# Patient Record
Sex: Female | Born: 1955 | Race: White | Hispanic: No | Marital: Married | Smoking: Never smoker
Health system: Southern US, Community
[De-identification: ages and names within clinical notes are randomized; demographics above are authoritative.]

## PROBLEM LIST (undated history)

## (undated) DIAGNOSIS — Z923 Personal history of irradiation: Secondary | ICD-10-CM

## (undated) DIAGNOSIS — Z9889 Other specified postprocedural states: Secondary | ICD-10-CM

## (undated) DIAGNOSIS — E039 Hypothyroidism, unspecified: Secondary | ICD-10-CM

## (undated) DIAGNOSIS — R112 Nausea with vomiting, unspecified: Secondary | ICD-10-CM

---
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
## 1999-06-12 ENCOUNTER — Other Ambulatory Visit: Admission: RE | Admit: 1999-06-12 | Discharge: 1999-06-12 | Payer: Self-pay | Admitting: Obstetrics & Gynecology

## 2001-03-08 ENCOUNTER — Other Ambulatory Visit: Admission: RE | Admit: 2001-03-08 | Discharge: 2001-03-08 | Payer: Self-pay | Admitting: Obstetrics & Gynecology

## 2002-09-06 ENCOUNTER — Other Ambulatory Visit: Admission: RE | Admit: 2002-09-06 | Discharge: 2002-09-06 | Payer: Self-pay | Admitting: Obstetrics & Gynecology

## 2003-09-18 ENCOUNTER — Other Ambulatory Visit: Admission: RE | Admit: 2003-09-18 | Discharge: 2003-09-18 | Payer: Self-pay | Admitting: Obstetrics & Gynecology

## 2004-11-20 ENCOUNTER — Other Ambulatory Visit: Admission: RE | Admit: 2004-11-20 | Discharge: 2004-11-20 | Payer: Self-pay | Admitting: Obstetrics & Gynecology

## 2006-06-22 HISTORY — PX: EYE SURGERY: SHX253

## 2020-08-14 LAB — COLOGUARD: COLOGUARD: NEGATIVE

## 2021-04-02 ENCOUNTER — Other Ambulatory Visit: Payer: Self-pay | Admitting: Obstetrics & Gynecology

## 2021-04-02 DIAGNOSIS — N6320 Unspecified lump in the left breast, unspecified quadrant: Secondary | ICD-10-CM

## 2021-04-10 ENCOUNTER — Ambulatory Visit
Admission: RE | Admit: 2021-04-10 | Discharge: 2021-04-10 | Disposition: A | Discharge status: Home / Self Care | Payer: BC Managed Care – PPO | Source: Ambulatory Visit | Attending: Obstetrics & Gynecology | Admitting: Obstetrics & Gynecology

## 2021-04-10 ENCOUNTER — Ambulatory Visit
Admission: RE | Admit: 2021-04-10 | Discharge: 2021-04-10 | Disposition: A | Discharge status: Home / Self Care | Payer: Self-pay | Source: Ambulatory Visit | Attending: Obstetrics & Gynecology | Admitting: Obstetrics & Gynecology

## 2021-04-10 ENCOUNTER — Other Ambulatory Visit: Payer: Self-pay | Admitting: Obstetrics & Gynecology

## 2021-04-10 ENCOUNTER — Other Ambulatory Visit: Payer: Self-pay

## 2021-04-10 DIAGNOSIS — N6320 Unspecified lump in the left breast, unspecified quadrant: Secondary | ICD-10-CM

## 2021-04-15 ENCOUNTER — Ambulatory Visit
Admission: RE | Admit: 2021-04-15 | Discharge: 2021-04-15 | Disposition: A | Discharge status: Home / Self Care | Payer: BC Managed Care – PPO | Source: Ambulatory Visit | Attending: Obstetrics & Gynecology | Admitting: Obstetrics & Gynecology

## 2021-04-15 DIAGNOSIS — N6320 Unspecified lump in the left breast, unspecified quadrant: Secondary | ICD-10-CM

## 2021-04-15 DIAGNOSIS — C801 Malignant (primary) neoplasm, unspecified: Secondary | ICD-10-CM

## 2021-04-15 HISTORY — DX: Malignant (primary) neoplasm, unspecified: C80.1

## 2021-04-17 ENCOUNTER — Other Ambulatory Visit: Payer: BC Managed Care – PPO

## 2021-04-28 ENCOUNTER — Other Ambulatory Visit: Payer: Self-pay | Admitting: General Surgery

## 2021-04-28 DIAGNOSIS — C50412 Malignant neoplasm of upper-outer quadrant of left female breast: Secondary | ICD-10-CM

## 2021-04-28 DIAGNOSIS — Z170 Estrogen receptor positive status [ER+]: Secondary | ICD-10-CM

## 2021-04-30 NOTE — Progress Notes (Signed)
Surgical Instructions    Your procedure is scheduled on 05/08/21.  Report to [facility] Main Entrance "A" at 12:00 P.M., then check in with the Admitting office.  Call this number if you have problems the morning of surgery:  [phone]   If you have any questions prior to your surgery date call [phone]: Open Monday-Friday 8am-4pm    Remember:  Do not eat after midnight the night before your surgery  You may drink clear liquids until 11:00am the morning of your surgery.   Clear liquids allowed are: Water, Non-Citrus Juices (without pulp), Carbonated Beverages, Clear Tea, Black Coffee ONLY (NO MILK, CREAM OR POWDERED CREAMER of any kind), and Gatorade  Patient Instructions  The night before surgery:  No food after midnight. ONLY clear liquids after midnight  The day of surgery (if you do NOT have diabetes):  Drink ONE (1) Pre-Surgery Clear Ensure by 11:00am the morning of surgery. Drink in one sitting. Do not sip.  This drink was given to you during your hospital  pre-op appointment visit. Nothing else to drink after completing the  Pre-Surgery Clear Ensure.         If you have questions, please contact your surgeon's office.     Take these medicines the morning of surgery with A SIP OF WATER  cetirizine (ZYRTEC) fluticasone (FLONASE) SYNTHROID  meclizine (ANTIVERT) IF NEEDED  As of today, STOP taking any Aspirin (unless otherwise instructed by your surgeon) Aleve, Naproxen, Ibuprofen, Motrin, Advil, Goody's, BC's, all herbal medications, fish oil, and all vitamins.     After your COVID test   You are not required to quarantine however you are required to wear a well-fitting mask when you are out and around people not in your household.  If your mask becomes wet or soiled, replace with a new one.  Wash your hands often with soap and water for 20 seconds or clean your hands with an alcohol-based hand sanitizer that contains at least 60% alcohol.  Do not share  personal items.  Notify your provider: if you are in close contact with someone who has COVID  or if you develop a fever of 100.4 or greater, sneezing, cough, sore throat, shortness of breath or body aches.             Do not wear jewelry or makeup Do not wear lotions, powders, perfumes/colognes, or deodorant. Do not shave 48 hours prior to surgery.   Do not bring valuables to the hospital. DO Not wear nail polish, gel polish, artificial nails, or any other type of covering on natural nails including finger and toenails. If patients have artificial nails, gel coating, etc. that need to be removed by a nail salon, please have this removed prior to surgery or surgery may need to be canceled/delayed if the surgeon/ anesthesia feels like the patient is unable to be adequately monitored.             [facility] is not responsible for any belongings or valuables.  Do NOT Smoke (Tobacco/Vaping)  24 hours prior to your procedure  If you use a CPAP at night, you may bring your mask for your overnight stay.   Contacts, glasses, hearing aids, dentures or partials may not be worn into surgery, please bring cases for these belongings   For patients admitted to the hospital, discharge time will be determined by your treatment team.   Patients discharged the day of surgery will not be allowed to drive home, and someone needs  to stay with them for 24 hours.  NO VISITORS WILL BE ALLOWED IN PRE-OP WHERE PATIENTS ARE PREPPED FOR SURGERY.  ONLY 1 SUPPORT PERSON MAY BE PRESENT IN THE WAITING ROOM WHILE YOU ARE IN SURGERY.  IF YOU ARE TO BE ADMITTED, ONCE YOU ARE IN YOUR ROOM YOU WILL BE ALLOWED TWO (2) VISITORS. 1 (ONE) VISITOR MAY STAY OVERNIGHT BUT MUST ARRIVE TO THE ROOM BY 8pm.  Minor children may have two parents present. Special consideration for safety and communication needs will be reviewed on a case by case basis.  Special instructions:    Oral Hygiene is also important to reduce your risk of  infection.  Remember - BRUSH YOUR TEETH THE MORNING OF SURGERY WITH YOUR REGULAR TOOTHPASTE   [facility]- Preparing For Surgery  Before surgery, you can play an important role. Because skin is not sterile, your skin needs to be as free of germs as possible. You can reduce the number of germs on your skin by washing with CHG (chlorahexidine gluconate) Soap before surgery.  CHG is an antiseptic cleaner which kills germs and bonds with the skin to continue killing germs even after washing.     Please do not use if you have an allergy to CHG or antibacterial soaps. If your skin becomes reddened/irritated stop using the CHG.  Do not shave (including legs and underarms) for at least 48 hours prior to first CHG shower. It is OK to shave your face.  Please follow these instructions carefully.     Shower the NIGHT BEFORE SURGERY and the MORNING OF SURGERY with CHG Soap.   If you chose to wash your hair, wash your hair first as usual with your normal shampoo. After you shampoo, rinse your hair and body thoroughly to remove the shampoo.  Then [employer] and genitals (private parts) with your normal soap and rinse thoroughly to remove soap.  After that Use CHG Soap as you would any other liquid soap. You can apply CHG directly to the skin and wash gently with a scrungie or a clean washcloth.   Apply the CHG Soap to your body ONLY FROM THE NECK DOWN.  Do not use on open wounds or open sores. Avoid contact with your eyes, ears, mouth and genitals (private parts). Wash Face and genitals (private parts)  with your normal soap.   Wash thoroughly, paying special attention to the area where your surgery will be performed.  Thoroughly rinse your body with warm water from the neck down.  DO NOT shower/wash with your normal soap after using and rinsing off the CHG Soap.  Pat yourself dry with a CLEAN TOWEL.  Wear CLEAN PAJAMAS to bed the night before surgery  Place CLEAN SHEETS on your bed the night before  your surgery  DO NOT SLEEP WITH PETS.   Day of Surgery: Take a shower with CHG soap. Wear Clean/Comfortable clothing the morning of surgery Do not apply any deodorants/lotions.   Remember to brush your teeth WITH YOUR REGULAR TOOTHPASTE.   Please read over the following fact sheets that you were given.

## 2021-05-01 ENCOUNTER — Encounter (HOSPITAL_COMMUNITY): Payer: Self-pay

## 2021-05-01 ENCOUNTER — Other Ambulatory Visit: Payer: Self-pay

## 2021-05-01 ENCOUNTER — Encounter (HOSPITAL_COMMUNITY)
Admission: RE | Admit: 2021-05-01 | Discharge: 2021-05-01 | Disposition: A | Discharge status: Home / Self Care | Payer: BC Managed Care – PPO | Source: Ambulatory Visit | Attending: General Surgery | Admitting: General Surgery

## 2021-05-01 VITALS — BP 142/88 | HR 81 | Temp 97.7°F | Resp 17 | Ht 64.0 in | Wt 155.3 lb

## 2021-05-01 DIAGNOSIS — Z01812 Encounter for preprocedural laboratory examination: Secondary | ICD-10-CM | POA: Diagnosis not present

## 2021-05-01 DIAGNOSIS — Z01818 Encounter for other preprocedural examination: Secondary | ICD-10-CM

## 2021-05-01 HISTORY — DX: Nausea with vomiting, unspecified: R11.2

## 2021-05-01 HISTORY — DX: Nausea with vomiting, unspecified: Z98.890

## 2021-05-01 HISTORY — DX: Hypothyroidism, unspecified: E03.9

## 2021-05-01 LAB — CBC
HCT: 44.1 % (ref 36.0–46.0)
Hemoglobin: 14.8 g/dL (ref 12.0–15.0)
MCH: 30.2 pg (ref 26.0–34.0)
MCHC: 33.6 g/dL (ref 30.0–36.0)
MCV: 90 fL (ref 80.0–100.0)
Platelets: 265 10*3/uL (ref 150–400)
RBC: 4.9 MIL/uL (ref 3.87–5.11)
RDW: 12.7 % (ref 11.5–15.5)
WBC: 6.1 10*3/uL (ref 4.0–10.5)
nRBC: 0 % (ref 0.0–0.2)

## 2021-05-01 NOTE — Progress Notes (Signed)
PCP - Dr. Terrill Mohr Cardiologist - denies  PPM/ICD - denies   Chest x-ray - denies EKG - denies Stress Test - denies ECHO - denies Cardiac Cath - denies  Sleep Study - denies   DM- denies  Blood Thinner Instructions: n/a Aspirin Instructions: n/a  ERAS Protcol - yes PRE-SURGERY Ensure- given  COVID TEST-  n/a, ambulatory surgery   Anesthesia review:  no  Patient denies shortness of breath, fever, cough and chest pain at PAT appointment   All instructions explained to the patient, with a verbal understanding of the material. Patient agrees to go over the instructions while at home for a better understanding. Patient also instructed to wear a mask in public for 3 days prior to surgery. The opportunity to ask questions was provided.

## 2021-05-05 NOTE — Progress Notes (Signed)
New Breast Cancer Diagnosis: Left breast UOQ  Did patient present with symptoms (if so, please note symptoms) or screening mammography?:Palpable mass    Location and Extent of disease :left breast. Located at 3 o'clock position, measured  0.7 cm in greatest dimension. Adenopathy no.  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 Barry Dienes -Left breast Lumpectomy with SLN biopsy 05/08/2021  Medical oncologist, treatment if any:  Dr. Burr Medico 05/06/2021   Family History of Breast/Ovarian/Prostate Cancer: Mother had breast cancer diagnosed in her 16's, Maternal aunt had breast cancer.  Lymphedema issues, if any: No      Pain issues, if any: Has slight tenderness.  SAFETY ISSUES: Prior radiation? No Pacemaker/ICD? No Possible current pregnancy? Potmenopausal Is the patient on methotrexate? No  Current Complaints / other details:   Genetics:

## 2021-05-06 ENCOUNTER — Encounter: Payer: Self-pay | Admitting: Hematology

## 2021-05-06 ENCOUNTER — Encounter: Payer: Self-pay | Admitting: Radiation Oncology

## 2021-05-06 ENCOUNTER — Ambulatory Visit
Admission: RE | Admit: 2021-05-06 | Discharge: 2021-05-06 | Disposition: A | Discharge status: Home / Self Care | Payer: BC Managed Care – PPO | Source: Ambulatory Visit | Attending: Radiation Oncology | Admitting: Radiation Oncology

## 2021-05-06 ENCOUNTER — Other Ambulatory Visit: Payer: Self-pay

## 2021-05-06 ENCOUNTER — Telehealth: Payer: Self-pay | Admitting: Hematology

## 2021-05-06 ENCOUNTER — Inpatient Hospital Stay: Payer: BC Managed Care – PPO | Attending: Hematology | Admitting: Hematology

## 2021-05-06 VITALS — BP 139/71 | HR 80 | Temp 97.8°F | Resp 16 | Ht 64.0 in | Wt 156.3 lb

## 2021-05-06 DIAGNOSIS — Z170 Estrogen receptor positive status [ER+]: Secondary | ICD-10-CM | POA: Insufficient documentation

## 2021-05-06 DIAGNOSIS — Z8052 Family history of malignant neoplasm of bladder: Secondary | ICD-10-CM | POA: Diagnosis not present

## 2021-05-06 DIAGNOSIS — Z808 Family history of malignant neoplasm of other organs or systems: Secondary | ICD-10-CM | POA: Insufficient documentation

## 2021-05-06 DIAGNOSIS — C50012 Malignant neoplasm of nipple and areola, left female breast: Secondary | ICD-10-CM | POA: Insufficient documentation

## 2021-05-06 DIAGNOSIS — Z803 Family history of malignant neoplasm of breast: Secondary | ICD-10-CM | POA: Insufficient documentation

## 2021-05-06 DIAGNOSIS — E039 Hypothyroidism, unspecified: Secondary | ICD-10-CM | POA: Insufficient documentation

## 2021-05-06 NOTE — Progress Notes (Signed)
[place]   [phone] [phone]   Clinic New Consult Note   Patient Care Team: Nickola Major, MD as PCP - General (Family Medicine)  Date of Service:  05/06/2021   CHIEF COMPLAINTS/PURPOSE OF CONSULTATION:  Left Breast Cancer, ER+  REFERRING PHYSICIAN:  Dr. Barry Dienes   ASSESSMENT & PLAN:  Sheila Bryant is a 65 y.o. postmenopausal female with a history of hypothyroidism   1. 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 B/l diagnostic MM and [place] 04/10/21 showed 0.9 cm mass at 3 o'clock retroareolar. Biopsy 04/15/21 confirmed invasive ductal carcinoma, grade 2-3 --We discussed her imaging findings and the biopsy results in great details. -She met Dr. Barry Dienes on 04/28/21 and is scheduled for lumpectomy on 05/08/21. -I recommend a Oncotype Dx test on the surgical sample and we'll make a decision about adjuvant chemotherapy based on the Oncotype result. Written material of this test was given to her. She is young and fit, would be a good candidate for chemotherapy if her Oncotype recurrence score is high. -If her surgical sentinel lymph node is positive, I recommend mammaprint for further risk stratification and guide adjuvant chemotherapy. -Giving the strong ER and PR expression in her postmenopausal status, I recommend adjuvant endocrine therapy with aromatase inhibitor for a total of 5-10 years to reduce the risk of cancer recurrence. Potential benefits and side effects were discussed with patient and she is interested. She was on estrogen replacement before, which she has stopped recently after cancer diagnosis. -She will also see radiation oncologist Dr. Lisbeth Renshaw today. She will likely benefit from breast radiation if she undergo lumpectomy to decrease the risk of breast cancer.  -We also discussed the breast cancer surveillance after her surgery. She will continue  annual screening mammogram, self exam, and a routine office visit with lab and exam with [place]. -I encouraged her to have healthy diet and exercise regularly.   2. Bone Health  -she reports her last DEXA was a number of years ago. We will obtain a new one for baseline.  3. Genetics  -Given her family history of breast cancer in her mother and maternal aunt, I recommended genetic testing, will set up for her   PLAN:  -proceed with lumpectomy and SLN biopsy on 05/08/21 with Dr. Barry Dienes -Oncotype on surgical sample  -I will see her 2-3 weeks after surgery to review her pathology and oncotype results.   Oncology History  Malignant neoplasm of areola of left breast in female, estrogen receptor positive ([place])  04/10/2021 Mammogram   EXAM: DIGITAL DIAGNOSTIC BILATERAL MAMMOGRAM WITH TOMOSYNTHESIS AND CAD; ULTRASOUND LEFT BREAST LIMITED; ULTRASOUND RIGHT BREAST LIMITED  IMPRESSION: 1. Suspicious palpable 0.9 cm mass in the 3 o'clock retroareolar region of the LEFT breast for which biopsy is recommended. 2. LEFT axilla is negative.   04/15/2021 Pathology Results   Diagnosis Breast, left, needle core biopsy, 3 o'clock retroareolar - INVASIVE DUCTAL CARCINOMA - SEE COMMENT Microscopic Comment Based on the biopsy, the carcinoma appears Nottingham grade 2-3 of 3 and measures 0.7 cm in greatest linear extent.  PROGNOSTIC INDICATORS Results: The tumor cells are EQUIVOCAL for Her2 (2+). Her2 by FISH will be performed and results reported separately. Estrogen Receptor: 100%, POSITIVE, STRONG STAINING INTENSITY Progesterone Receptor: 100%, POSITIVE, STRONG STAINING INTENSITY Proliferation Marker Ki67: 15%  FLUORESCENCE IN-SITU HYBRIDIZATION Results: GROUP 5: HER2 **NEGATIVE**   05/06/2021 Initial Diagnosis   Malignant neoplasm of areola of  left breast in female, estrogen receptor positive ([place])      HISTORY OF PRESENTING ILLNESS:  Sheila Bryant 65 y.o. female is a here because of breast  cancer. The patient was referred by Dr. Barry Dienes. The patient presents to the clinic today alone.   She presented with a palpable left breast lump for about a week. She underwent bilateral diagnostic mammography and bilateral breast ultrasonography on 04/10/21 showing: palpable 0.9 cm mass in left breast at 3 o'clock retroareolar region; left axilla is negative.  Biopsy on 04/15/21 showed: invasive ductal carcinoma, grade 2-3. Prognostic indicators significant for: estrogen receptor, 100% positive and progesterone receptor, 100% positive. Proliferation marker Ki67 at 15%. HER2 negative by FISH.   Today the patient notes they felt/feeling prior/after... -no concerns   She has a PMHx of.... -s/p 2 c-sections    Socially... -works in administration at [employer] -she is married -breast cancer in mother at 38-72 and maternal aunt, colon cancer in maternal brother, bladder cancer in paternal aunt, GYN cancer (unsure type) in maternal grandmother   GYN HISTORY  Menarchal: 74 years old LMP: "31 years ago" (~65 years old) Contraceptive: used for ~10 years HRT: used for 10+ years (unsure if she started when her menopause did), and stopped about a week ago. GP: 2, first age 30   REVIEW OF SYSTEMS:    Constitutional: Denies fevers, chills or abnormal night sweats Eyes: Denies blurriness of vision, double vision or watery eyes Ears, nose, mouth, throat, and face: Denies mucositis or sore throat Respiratory: Denies cough, dyspnea or wheezes Cardiovascular: Denies palpitation, chest discomfort or lower extremity swelling Gastrointestinal:  Denies nausea, heartburn or change in bowel habits Skin: Denies abnormal skin rashes Lymphatics: Denies new lymphadenopathy or easy bruising Neurological:Denies numbness, tingling or new weaknesses Behavioral/Psych: Mood is stable, no new changes  All other systems were reviewed with the patient and are negative.   MEDICAL HISTORY:  Past  Medical History:  Diagnosis Date   Cancer ([place]) 04/15/2021   left breast   Hypothyroidism    PONV (postoperative nausea and vomiting)     SURGICAL HISTORY: Past Surgical History:  Procedure Laterality Date   CESAREAN SECTION     x2 (1987 and 1990)   EYE SURGERY Right 2008   clogged tear duct   WISDOM TOOTH EXTRACTION  1977    SOCIAL HISTORY: Social History   Socioeconomic History   Marital status: Married    Spouse name: Not on file   Number of children: 2   Years of education: Not on file   Highest education level: Not on file  Occupational History   Not on file  Tobacco Use   Smoking status: Never   Smokeless tobacco: Never  Vaping Use   Vaping Use: Never used  Substance and Sexual Activity   Alcohol use: Yes    Comment: "a couple beers once a month"   Drug use: Never   Sexual activity: Yes    Birth control/protection: Post-menopausal  Other Topics Concern   Not on file  Social History Narrative   Not on file   Social Determinants of Health   Financial Resource Strain: Not on file  Food Insecurity: Not on file  Transportation Needs: Not on file  Physical Activity: Not on file  Stress: Not on file  Social Connections: Not on file  Intimate Partner Violence: Not on file    FAMILY HISTORY: Family History  Problem Relation Age of Onset   Breast cancer Mother 79  Cancer Mother 9       breast cancer   COPD Mother    Heart disease Mother    Diabetes Father    Heart disease Father    Cancer Brother        bladder cancer   Cancer Maternal Aunt 11       breast cancer   Cancer Paternal Aunt        bladder cancer   Cancer Maternal Grandmother        uterine cancer   ADD / ADHD Daughter    ADD / ADHD Son     ALLERGIES:  is allergic to tape.  MEDICATIONS:  Current Outpatient Medications  Medication Sig Dispense Refill   cetirizine (ZYRTEC) 10 MG tablet Take 10 mg by mouth daily.     fluticasone (FLONASE) 50 MCG/ACT nasal spray Place 1 spray  into both nostrils daily.     meclizine (ANTIVERT) 25 MG tablet Take 25 mg by mouth 3 (three) times daily as needed for dizziness.     SYNTHROID 75 MCG tablet Take 75 mcg by mouth daily before breakfast.     No current facility-administered medications for this visit.    PHYSICAL EXAMINATION: ECOG PERFORMANCE STATUS: 0 - Asymptomatic  Vitals:   05/06/21 1317  BP: 139/71  Pulse: 80  Resp: 16  Temp: 97.8 F (36.6 C)  SpO2: 100%   Filed Weights   05/06/21 1317  Weight: 156 lb 4.8 oz (70.9 kg)    GENERAL:alert, no distress and comfortable SKIN: skin color, texture, turgor are normal, no rashes or significant lesions EYES: normal, Conjunctiva are pink and non-injected, sclera clear  NECK: supple, thyroid normal size, non-tender, without nodularity LYMPH:  no palpable lymphadenopathy in the cervical, axillary  LUNGS: clear to auscultation and percussion with normal breathing effort HEART: regular rate & rhythm and no murmurs and no lower extremity edema ABDOMEN:abdomen soft, non-tender and normal bowel sounds Musculoskeletal:no cyanosis of digits and no clubbing  NEURO: alert & oriented x 3 with fluent speech, no focal motor/sensory deficits BREAST: very subtle nodule at 3 o'clock left breast next to nipple. No palpable mass or adenopathy bilaterally.  LABORATORY DATA:  I have reviewed the data as listed CBC Latest Ref Rng & Units 05/01/2021  WBC 4.0 - 10.5 K/uL 6.1  Hemoglobin 12.0 - 15.0 g/dL 14.8  Hematocrit 36.0 - 46.0 % 44.1  Platelets 150 - 400 K/uL 265    No flowsheet data found.   RADIOGRAPHIC STUDIES: I have personally reviewed the radiological images as listed and agreed with the findings in the report. US BREAST LTD UNI LEFT INC AXILLA  Result Date: 04/10/2021 CLINICAL DATA:  Palpable mass in the LEFT breast for 1 month. EXAM: DIGITAL DIAGNOSTIC BILATERAL MAMMOGRAM WITH TOMOSYNTHESIS AND CAD; ULTRASOUND LEFT BREAST LIMITED; ULTRASOUND RIGHT BREAST LIMITED  TECHNIQUE: Bilateral digital diagnostic mammography and breast tomosynthesis was performed. The images were evaluated with computer-aided detection.; Targeted ultrasound examination of the left breast was performed.; Targeted ultrasound examination of the right breast was performed COMPARISON:  Previous exam(s). ACR Breast Density Category c: The breast tissue is heterogeneously dense, which may obscure small masses. FINDINGS: RIGHT BREAST: Mammogram: A possible obscured mass is identified in the posterior central portion of the RIGHT breast. With additional images, there is no definitive abnormality. Mammographic images were processed with CAD. Physical Exam: I palpate no abnormality in the central portions of the RIGHT breast. Ultrasound: Targeted ultrasound is performed, showing normal appearing dense fibroglandular tissue in the  12 o'clock, retroareolar, and 6 o'clock locations of the RIGHT breast. No suspicious mass, distortion, or acoustic shadowing is demonstrated with ultrasound. LEFT BREAST: Mammogram: There is focal asymmetry in the LATERAL retroareolar region of the LEFT breast, marked with a BB as palpable. Spot tangential view suggests possible distortion associated with a superficial mass in this region. Mammographic images were processed with CAD. Physical Exam: I palpate a discrete oval mass in the 3 o'clock retroareolar region of the LEFT breast. Ultrasound: Targeted ultrasound is performed, showing an irregular hypoechoic mass in the 3 o'clock retroareolar region of the LEFT breast, measuring approximately 0.9 x 0.7 x 0 7 centimeters. At real-time exam, there is question of associated distortion. Evaluation of the LEFT axilla is negative for adenopathy. IMPRESSION: 1. Suspicious mass in the 3 o'clock retroareolar region of the LEFT breast for which biopsy is recommended. 2. LEFT axilla is negative. RECOMMENDATION: Recommend ultrasound-guided core biopsy LEFT breast. I have discussed the findings  and recommendations with the patient. If applicable, a reminder letter will be sent to the patient regarding the next appointment. BI-RADS CATEGORY  4: Suspicious. Electronically Signed   By: Nolon Nations M.D.   On: 04/10/2021 12:47  US BREAST LTD UNI RIGHT INC AXILLA  Result Date: 04/10/2021 CLINICAL DATA:  Palpable mass in the LEFT breast for 1 month. EXAM: DIGITAL DIAGNOSTIC BILATERAL MAMMOGRAM WITH TOMOSYNTHESIS AND CAD; ULTRASOUND LEFT BREAST LIMITED; ULTRASOUND RIGHT BREAST LIMITED TECHNIQUE: Bilateral digital diagnostic mammography and breast tomosynthesis was performed. The images were evaluated with computer-aided detection.; Targeted ultrasound examination of the left breast was performed.; Targeted ultrasound examination of the right breast was performed COMPARISON:  Previous exam(s). ACR Breast Density Category c: The breast tissue is heterogeneously dense, which may obscure small masses. FINDINGS: RIGHT BREAST: Mammogram: A possible obscured mass is identified in the posterior central portion of the RIGHT breast. With additional images, there is no definitive abnormality. Mammographic images were processed with CAD. Physical Exam: I palpate no abnormality in the central portions of the RIGHT breast. Ultrasound: Targeted ultrasound is performed, showing normal appearing dense fibroglandular tissue in the 12 o'clock, retroareolar, and 6 o'clock locations of the RIGHT breast. No suspicious mass, distortion, or acoustic shadowing is demonstrated with ultrasound. LEFT BREAST: Mammogram: There is focal asymmetry in the LATERAL retroareolar region of the LEFT breast, marked with a BB as palpable. Spot tangential view suggests possible distortion associated with a superficial mass in this region. Mammographic images were processed with CAD. Physical Exam: I palpate a discrete oval mass in the 3 o'clock retroareolar region of the LEFT breast. Ultrasound: Targeted ultrasound is performed, showing an  irregular hypoechoic mass in the 3 o'clock retroareolar region of the LEFT breast, measuring approximately 0.9 x 0.7 x 0 7 centimeters. At real-time exam, there is question of associated distortion. Evaluation of the LEFT axilla is negative for adenopathy. IMPRESSION: 1. Suspicious mass in the 3 o'clock retroareolar region of the LEFT breast for which biopsy is recommended. 2. LEFT axilla is negative. RECOMMENDATION: Recommend ultrasound-guided core biopsy LEFT breast. I have discussed the findings and recommendations with the patient. If applicable, a reminder letter will be sent to the patient regarding the next appointment. BI-RADS CATEGORY  4: Suspicious. Electronically Signed   By: Nolon Nations M.D.   On: 04/10/2021 12:47  MM DIAG BREAST TOMO BILATERAL  Result Date: 04/10/2021 CLINICAL DATA:  Palpable mass in the LEFT breast for 1 month. EXAM: DIGITAL DIAGNOSTIC BILATERAL MAMMOGRAM WITH TOMOSYNTHESIS AND CAD; ULTRASOUND LEFT BREAST  LIMITED; ULTRASOUND RIGHT BREAST LIMITED TECHNIQUE: Bilateral digital diagnostic mammography and breast tomosynthesis was performed. The images were evaluated with computer-aided detection.; Targeted ultrasound examination of the left breast was performed.; Targeted ultrasound examination of the right breast was performed COMPARISON:  Previous exam(s). ACR Breast Density Category c: The breast tissue is heterogeneously dense, which may obscure small masses. FINDINGS: RIGHT BREAST: Mammogram: A possible obscured mass is identified in the posterior central portion of the RIGHT breast. With additional images, there is no definitive abnormality. Mammographic images were processed with CAD. Physical Exam: I palpate no abnormality in the central portions of the RIGHT breast. Ultrasound: Targeted ultrasound is performed, showing normal appearing dense fibroglandular tissue in the 12 o'clock, retroareolar, and 6 o'clock locations of the RIGHT breast. No suspicious mass, distortion,  or acoustic shadowing is demonstrated with ultrasound. LEFT BREAST: Mammogram: There is focal asymmetry in the LATERAL retroareolar region of the LEFT breast, marked with a BB as palpable. Spot tangential view suggests possible distortion associated with a superficial mass in this region. Mammographic images were processed with CAD. Physical Exam: I palpate a discrete oval mass in the 3 o'clock retroareolar region of the LEFT breast. Ultrasound: Targeted ultrasound is performed, showing an irregular hypoechoic mass in the 3 o'clock retroareolar region of the LEFT breast, measuring approximately 0.9 x 0.7 x 0 7 centimeters. At real-time exam, there is question of associated distortion. Evaluation of the LEFT axilla is negative for adenopathy. IMPRESSION: 1. Suspicious mass in the 3 o'clock retroareolar region of the LEFT breast for which biopsy is recommended. 2. LEFT axilla is negative. RECOMMENDATION: Recommend ultrasound-guided core biopsy LEFT breast. I have discussed the findings and recommendations with the patient. If applicable, a reminder letter will be sent to the patient regarding the next appointment. BI-RADS CATEGORY  4: Suspicious. Electronically Signed   By: Nolon Nations M.D.   On: 04/10/2021 12:47  MM CLIP PLACEMENT LEFT  Result Date: 04/15/2021 CLINICAL DATA:  Status post left breast ultrasound-guided biopsy EXAM: 3D DIAGNOSTIC LEFT MAMMOGRAM POST ULTRASOUND BIOPSY COMPARISON:  Previous exam(s). FINDINGS: 3D Mammographic images were obtained following ultrasound guided biopsy of the left breast. The biopsy marking clip is in expected position at the site of biopsy. IMPRESSION: Appropriate positioning of the ribbon shaped biopsy marking clip at the site of biopsy in the retroareolar left breast. Final Assessment: Post Procedure Mammograms for Marker Placement Electronically Signed   By: Kristopher Oppenheim M.D.   On: 04/15/2021 09:24  [place] LT BREAST BX W LOC DEV 1ST LESION IMG BX SPEC US  GUIDE  Addendum Date: 04/18/2021   ADDENDUM REPORT: 04/18/2021 08:02 ADDENDUM: Pathology revealed GRADE II INVASIVE DUCTAL CARCINOMA of the LEFT breast, 3 o'clock, retroareolar, (ribbon clip). This was found to be concordant by Dr. Kristopher Oppenheim. Pathology results were discussed with the patient by telephone. The patient reported doing well after the biopsy with tenderness at the site. Post biopsy instructions and care were reviewed and questions were answered. The patient was encouraged to call [facility] for any additional concerns. My direct phone number was provided. Surgical consultation has been arranged with Dr. Stark Klein at [facility] on April 28, 2021. Pathology results reported by Terie Purser, RN on 04/18/2021. Electronically Signed   By: Kristopher Oppenheim M.D.   On: 04/18/2021 08:02   Result Date: 04/18/2021 CLINICAL DATA:  65 year old female with a suspicious left breast mass. EXAM: ULTRASOUND GUIDED LEFT BREAST CORE NEEDLE BIOPSY COMPARISON:  Previous exam(s).  PROCEDURE: I met with the patient and we discussed the procedure of ultrasound-guided biopsy, including benefits and alternatives. We discussed the high likelihood of a successful procedure. We discussed the risks of the procedure, including infection, bleeding, tissue injury, clip migration, and inadequate sampling. Informed written consent was given. The usual time-out protocol was performed immediately prior to the procedure. Lesion quadrant: Upper outer quadrant Using sterile technique and 1% Lidocaine as local anesthetic, under direct ultrasound visualization, a 14 gauge spring-loaded device was used to perform biopsy of a mass at the 3 o'clock retroareolar position using a lateral approach. At the conclusion of the procedure a ribbon shaped tissue marker clip was deployed into the biopsy cavity. Follow up 2 view mammogram was performed and dictated separately. IMPRESSION: Ultrasound guided  biopsy of the left breast. No apparent complications. Electronically Signed: By: Kristopher Oppenheim M.D. On: 04/15/2021 09:19    No orders of the defined types were placed in this encounter.   All questions were answered. The patient knows to call the clinic with any problems, questions or concerns. The total time spent in the appointment was 45 minutes.     Truitt Merle, MD 05/06/2021 9:03 PM  I, Wilburn Mylar, am acting as scribe for Truitt Merle, MD.   I have reviewed the above documentation for accuracy and completeness, and I agree with the above.

## 2021-05-06 NOTE — Progress Notes (Signed)
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   HISTORY OF PRESENT ILLNESS: Sheila Bryant is a 65 y.o. female seen at the request of Dr. Barry Dienes for a new diagnosis of left breast cancer.  The patient initially palpated a mass in the left breast and diagnostic imaging revealed a mass in the 3 o'clock position retroareolar measuring up to 9 mm. Her left axilla was negative for adenopathy. Additional evaluation of the right breast was also done due to possible finding on diagnostic mammogram for her left breast finding. This right ultrasound showed no mass or abnormality. 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  She met with Dr. Barry Dienes and has been offered left lumpectomy with sentinel lymph node biopsy.  This is scheduled for this Thursday.  She is seen to discuss adjuvant radiotherapy.     PREVIOUS RADIATION THERAPY: No   PAST MEDICAL HISTORY:  Past Medical History:  Diagnosis Date   Cancer ([place]) 04/15/2021   left breast   Hypothyroidism    PONV (postoperative nausea and vomiting)        PAST SURGICAL HISTORY: Past Surgical History:  Procedure Laterality Date   CESAREAN SECTION     x2 (1987 and 1990)   EYE SURGERY Right 2008   clogged tear duct   WISDOM TOOTH EXTRACTION  1977     FAMILY HISTORY:  Family History  Problem Relation Age of Onset   Breast cancer Mother 38   Cancer Mother 64       breast cancer   COPD Mother    Heart disease Mother    Diabetes Father    Heart disease Father    Cancer Brother         bladder cancer   Cancer Maternal Aunt 78       breast cancer   Cancer Paternal Aunt        bladder cancer   Cancer Maternal Grandmother        uterine cancer   ADD / ADHD Daughter    ADD / ADHD Son      SOCIAL HISTORY:  reports that she has never smoked. She has never used smokeless tobacco. She reports current alcohol use. She reports that she does not use drugs. The patient is married and lives in [place]. 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    ALLERGIES: Tape   MEDICATIONS:  Current Outpatient Medications  Medication Sig Dispense Refill   cetirizine (ZYRTEC) 10 MG tablet Take 10 mg by mouth daily.     fluticasone (FLONASE) 50 MCG/ACT nasal spray Place 1 spray into both nostrils daily.     meclizine (ANTIVERT) 25 MG tablet Take 25 mg by mouth 3 (three) times daily as needed for dizziness.     SYNTHROID 75 MCG tablet Take 75 mcg by mouth daily before breakfast.     No current facility-administered medications for this encounter.     REVIEW OF SYSTEMS: On review of systems, the patient reports  she is doing well with her diagnosis and very active and ready to have her surgery behind her. She's very active in the community, with her family and young grandchildren, and looking forward to her mother's 48th birthday next month. No complaints are verbalized.      PHYSICAL EXAM:  Wt Readings from Last 3 Encounters:  05/06/21 156 lb 4.8 oz (70.9 kg)  05/01/21 155 lb 4.8 oz (70.4 kg)   Temp Readings from Last 3 Encounters:  05/06/21 97.8 F (36.6 C) (Temporal)  05/01/21 97.7 F (36.5 C) (Oral)   BP Readings from Last 3 Encounters:  05/06/21 139/71  05/01/21 (!) 142/88   Pulse Readings from Last 3 Encounters:  05/06/21 80  05/01/21 81    In general this is a well appearing caucasian female in no acute distress. She's alert and oriented x4 and appropriate throughout the examination. Cardiopulmonary assessment is negative for acute distress  and she exhibits normal effort. Bilateral breast exam is deferred.    ECOG = 0  0 - Asymptomatic (Fully active, able to carry on all predisease activities without restriction)  1 - Symptomatic but completely ambulatory (Restricted in physically strenuous activity but ambulatory and able to carry out work of a light or sedentary nature. For example, light housework, office work)  2 - Symptomatic, <50% in bed during the day (Ambulatory and capable of all self care but unable to carry out any work activities. Up and about more than 50% of waking hours)  3 - Symptomatic, >50% in bed, but not bedbound (Capable of only limited self-care, confined to bed or chair 50% or more of waking hours)  4 - Bedbound (Completely disabled. Cannot carry on any self-care. Totally confined to bed or chair)  5 - Death   Eustace Pen MM, Creech RH, Tormey DC, et al. [phone]). "Toxicity and response criteria of the [facility] Group". [place] Oncol. 5 (6): 649-55    LABORATORY DATA:  Lab Results  Component Value Date   WBC 6.1 05/01/2021   HGB 14.8 05/01/2021   HCT 44.1 05/01/2021   MCV 90.0 05/01/2021   PLT 265 05/01/2021   No results found for: NA, K, CL, CO2 No results found for: ALT, AST, GGT, ALKPHOS, BILITOT    RADIOGRAPHY: US BREAST LTD UNI LEFT INC AXILLA  Result Date: 04/10/2021 CLINICAL DATA:  Palpable mass in the LEFT breast for 1 month. EXAM: DIGITAL DIAGNOSTIC BILATERAL MAMMOGRAM WITH TOMOSYNTHESIS AND CAD; ULTRASOUND LEFT BREAST LIMITED; ULTRASOUND RIGHT BREAST LIMITED TECHNIQUE: Bilateral digital diagnostic mammography and breast tomosynthesis was performed. The images were evaluated with computer-aided detection.; Targeted ultrasound examination of the left breast was performed.; Targeted ultrasound examination of the right breast was performed COMPARISON:  Previous exam(s). ACR Breast Density Category c: The breast tissue is heterogeneously dense, which may obscure small  masses. FINDINGS: RIGHT BREAST: Mammogram: A possible obscured mass is identified in the posterior central portion of the RIGHT breast. With additional images, there is no definitive abnormality. Mammographic images were processed with CAD. Physical Exam: I palpate no abnormality in the central portions of the RIGHT breast. Ultrasound: Targeted ultrasound is performed, showing normal appearing dense fibroglandular tissue in the 12 o'clock, retroareolar, and 6 o'clock locations of the RIGHT breast. No suspicious mass, distortion, or acoustic shadowing is demonstrated with ultrasound. LEFT BREAST: Mammogram: There is focal asymmetry in the LATERAL retroareolar region of the LEFT breast, marked with a BB as palpable. Spot tangential view suggests possible distortion associated with a  superficial mass in this region. Mammographic images were processed with CAD. Physical Exam: I palpate a discrete oval mass in the 3 o'clock retroareolar region of the LEFT breast. Ultrasound: Targeted ultrasound is performed, showing an irregular hypoechoic mass in the 3 o'clock retroareolar region of the LEFT breast, measuring approximately 0.9 x 0.7 x 0 7 centimeters. At real-time exam, there is question of associated distortion. Evaluation of the LEFT axilla is negative for adenopathy. IMPRESSION: 1. Suspicious mass in the 3 o'clock retroareolar region of the LEFT breast for which biopsy is recommended. 2. LEFT axilla is negative. RECOMMENDATION: Recommend ultrasound-guided core biopsy LEFT breast. I have discussed the findings and recommendations with the patient. If applicable, a reminder letter will be sent to the patient regarding the next appointment. BI-RADS CATEGORY  4: Suspicious. Electronically Signed   By: Nolon Nations M.D.   On: 04/10/2021 12:47  US BREAST LTD UNI RIGHT INC AXILLA  Result Date: 04/10/2021 CLINICAL DATA:  Palpable mass in the LEFT breast for 1 month. EXAM: DIGITAL DIAGNOSTIC BILATERAL MAMMOGRAM WITH  TOMOSYNTHESIS AND CAD; ULTRASOUND LEFT BREAST LIMITED; ULTRASOUND RIGHT BREAST LIMITED TECHNIQUE: Bilateral digital diagnostic mammography and breast tomosynthesis was performed. The images were evaluated with computer-aided detection.; Targeted ultrasound examination of the left breast was performed.; Targeted ultrasound examination of the right breast was performed COMPARISON:  Previous exam(s). ACR Breast Density Category c: The breast tissue is heterogeneously dense, which may obscure small masses. FINDINGS: RIGHT BREAST: Mammogram: A possible obscured mass is identified in the posterior central portion of the RIGHT breast. With additional images, there is no definitive abnormality. Mammographic images were processed with CAD. Physical Exam: I palpate no abnormality in the central portions of the RIGHT breast. Ultrasound: Targeted ultrasound is performed, showing normal appearing dense fibroglandular tissue in the 12 o'clock, retroareolar, and 6 o'clock locations of the RIGHT breast. No suspicious mass, distortion, or acoustic shadowing is demonstrated with ultrasound. LEFT BREAST: Mammogram: There is focal asymmetry in the LATERAL retroareolar region of the LEFT breast, marked with a BB as palpable. Spot tangential view suggests possible distortion associated with a superficial mass in this region. Mammographic images were processed with CAD. Physical Exam: I palpate a discrete oval mass in the 3 o'clock retroareolar region of the LEFT breast. Ultrasound: Targeted ultrasound is performed, showing an irregular hypoechoic mass in the 3 o'clock retroareolar region of the LEFT breast, measuring approximately 0.9 x 0.7 x 0 7 centimeters. At real-time exam, there is question of associated distortion. Evaluation of the LEFT axilla is negative for adenopathy. IMPRESSION: 1. Suspicious mass in the 3 o'clock retroareolar region of the LEFT breast for which biopsy is recommended. 2. LEFT axilla is negative.  RECOMMENDATION: Recommend ultrasound-guided core biopsy LEFT breast. I have discussed the findings and recommendations with the patient. If applicable, a reminder letter will be sent to the patient regarding the next appointment. BI-RADS CATEGORY  4: Suspicious. Electronically Signed   By: Nolon Nations M.D.   On: 04/10/2021 12:47  MM DIAG BREAST TOMO BILATERAL  Result Date: 04/10/2021 CLINICAL DATA:  Palpable mass in the LEFT breast for 1 month. EXAM: DIGITAL DIAGNOSTIC BILATERAL MAMMOGRAM WITH TOMOSYNTHESIS AND CAD; ULTRASOUND LEFT BREAST LIMITED; ULTRASOUND RIGHT BREAST LIMITED TECHNIQUE: Bilateral digital diagnostic mammography and breast tomosynthesis was performed. The images were evaluated with computer-aided detection.; Targeted ultrasound examination of the left breast was performed.; Targeted ultrasound examination of the right breast was performed COMPARISON:  Previous exam(s). ACR Breast Density Category c: The breast tissue is  heterogeneously dense, which may obscure small masses. FINDINGS: RIGHT BREAST: Mammogram: A possible obscured mass is identified in the posterior central portion of the RIGHT breast. With additional images, there is no definitive abnormality. Mammographic images were processed with CAD. Physical Exam: I palpate no abnormality in the central portions of the RIGHT breast. Ultrasound: Targeted ultrasound is performed, showing normal appearing dense fibroglandular tissue in the 12 o'clock, retroareolar, and 6 o'clock locations of the RIGHT breast. No suspicious mass, distortion, or acoustic shadowing is demonstrated with ultrasound. LEFT BREAST: Mammogram: There is focal asymmetry in the LATERAL retroareolar region of the LEFT breast, marked with a BB as palpable. Spot tangential view suggests possible distortion associated with a superficial mass in this region. Mammographic images were processed with CAD. Physical Exam: I palpate a discrete oval mass in the 3 o'clock  retroareolar region of the LEFT breast. Ultrasound: Targeted ultrasound is performed, showing an irregular hypoechoic mass in the 3 o'clock retroareolar region of the LEFT breast, measuring approximately 0.9 x 0.7 x 0 7 centimeters. At real-time exam, there is question of associated distortion. Evaluation of the LEFT axilla is negative for adenopathy. IMPRESSION: 1. Suspicious mass in the 3 o'clock retroareolar region of the LEFT breast for which biopsy is recommended. 2. LEFT axilla is negative. RECOMMENDATION: Recommend ultrasound-guided core biopsy LEFT breast. I have discussed the findings and recommendations with the patient. If applicable, a reminder letter will be sent to the patient regarding the next appointment. BI-RADS CATEGORY  4: Suspicious. Electronically Signed   By: Nolon Nations M.D.   On: 04/10/2021 12:47  MM CLIP PLACEMENT LEFT  Result Date: 04/15/2021 CLINICAL DATA:  Status post left breast ultrasound-guided biopsy EXAM: 3D DIAGNOSTIC LEFT MAMMOGRAM POST ULTRASOUND BIOPSY COMPARISON:  Previous exam(s). FINDINGS: 3D Mammographic images were obtained following ultrasound guided biopsy of the left breast. The biopsy marking clip is in expected position at the site of biopsy. IMPRESSION: Appropriate positioning of the ribbon shaped biopsy marking clip at the site of biopsy in the retroareolar left breast. Final Assessment: Post Procedure Mammograms for Marker Placement Electronically Signed   By: Kristopher Oppenheim M.D.   On: 04/15/2021 09:24  [place] LT BREAST BX W LOC DEV 1ST LESION IMG BX SPEC US GUIDE  Addendum Date: 04/18/2021   ADDENDUM REPORT: 04/18/2021 08:02 ADDENDUM: Pathology revealed GRADE II INVASIVE DUCTAL CARCINOMA of the LEFT breast, 3 o'clock, retroareolar, (ribbon clip). This was found to be concordant by Dr. Kristopher Oppenheim. Pathology results were discussed with the patient by telephone. The patient reported doing well after the biopsy with tenderness at the site. Post biopsy  instructions and care were reviewed and questions were answered. The patient was encouraged to call [facility] for any additional concerns. My direct phone number was provided. Surgical consultation has been arranged with Dr. Stark Klein at [facility] on April 28, 2021. Pathology results reported by Terie Purser, RN on 04/18/2021. Electronically Signed   By: Kristopher Oppenheim M.D.   On: 04/18/2021 08:02   Result Date: 04/18/2021 CLINICAL DATA:  65 year old female with a suspicious left breast mass. EXAM: ULTRASOUND GUIDED LEFT BREAST CORE NEEDLE BIOPSY COMPARISON:  Previous exam(s). PROCEDURE: I met with the patient and we discussed the procedure of ultrasound-guided biopsy, including benefits and alternatives. We discussed the high likelihood of a successful procedure. We discussed the risks of the procedure, including infection, bleeding, tissue injury, clip migration, and inadequate sampling. Informed written consent was given. The usual time-out protocol  was performed immediately prior to the procedure. Lesion quadrant: Upper outer quadrant Using sterile technique and 1% Lidocaine as local anesthetic, under direct ultrasound visualization, a 14 gauge spring-loaded device was used to perform biopsy of a mass at the 3 o'clock retroareolar position using a lateral approach. At the conclusion of the procedure a ribbon shaped tissue marker clip was deployed into the biopsy cavity. Follow up 2 view mammogram was performed and dictated separately. IMPRESSION: Ultrasound guided biopsy of the left breast. No apparent complications. Electronically Signed: By: Kristopher Oppenheim M.D. On: 04/15/2021 09:19      IMPRESSION/PLAN: 1. Stage IA, [id number], grade 3, ER/PR positive invasive ductal carcinoma of the left breast. Dr. Lisbeth Renshaw discusses the pathology findings and reviews the nature of early stage left breast disease. The consensus from the breast conference includes breast  conservation with lumpectomy with sentinel node biopsy. Depending on the size of the final tumor measurements rendered by pathology, the tumor may be tested for Oncotype Dx score to determine a role for systemic therapy. Provided that chemotherapy is not indicated, the patient's course would then be followed by external radiotherapy to the breast  to reduce risks of local recurrence followed by antiestrogen therapy. We discussed the risks, benefits, short, and long term effects of radiotherapy, as well as the curative intent, and the patient is interested in proceeding. Dr. Lisbeth Renshaw discusses the delivery and logistics of radiotherapy and anticipates a course of 4 or up to 6 1/2 weeks of radiotherapy to the left breast with deep inspiration breath hold technique. We will see her back a few weeks after surgery to discuss the simulation process and anticipate we starting radiotherapy about 4-6 weeks after surgery.  2. Possible genetic predisposition to malignancy. The patient is a candidate for genetic testing given her personal and family history. She was offered referral and was in the process of scheduling this appointment but needs to get back in touch with the schedulers for that department. I'll message Ms. Gwynn.   In a visit lasting 60 minutes, greater than 50% of the time was spent face to face reviewing her case, as well as in preparation of, discussing, and coordinating the patient's care.  The above documentation reflects my direct findings during this shared patient visit. Please see the separate note by Dr. Lisbeth Renshaw on this date for the remainder of the patient's plan of care.    Carola Rhine, [facility]    **Disclaimer: This note was dictated with voice recognition software. Similar sounding words can inadvertently be transcribed and this note may contain transcription errors which may not have been corrected upon publication of note.**

## 2021-05-06 NOTE — Telephone Encounter (Signed)
Scheduled appt per 11/14 referral. Pt is aware of appt date and time.  

## 2021-05-07 NOTE — H&P (Signed)
REFERRING PHYSICIAN: Stann Mainland  PROVIDER: Georgianne Fick, MD  Care Team: Patient Care Team: Terrill Mohr, MD as PCP - General (Family Medicine) Georgianne Fick, MD as Consulting Provider (Surgical Oncology) Danton Sewer, MD (Radiology)   MRN: [id number] DOB: October 14, 1955 DATE OF ENCOUNTER: 04/28/2021  Subjective   Chief Complaint: Breast Cancer   History of Present Illness: Sheila Bryant is a 65 y.o. female who is seen today as an office consultation at the request of Dr. Stann Mainland for evaluation of Breast Cancer .   Pt presents with a left breast cancer dx 03/2021 after self-detecting a palpable mass. Dx imaging showed a 9 mm mass at 3 o'clock in the retroareolar location. 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   She hasn't had a cancer dx before this. She does have family history of cancer, however. Her mother had breast cancer dx in her 41s. Her maternal grandmother had uterine cancer, maternal aunt had breast cancer, and a maternal uncle had bladder cancer.   Pt works as a Psychologist, clinical at [employer].  Diagnostic mammogram/us:04/10/21 ACR Breast Density Category c: The breast tissue is heterogeneously dense, which may obscure small masses.   FINDINGS: RIGHT BREAST:   Mammogram: A possible obscured mass is identified in the posterior central portion of the RIGHT breast. With additional images, there is no definitive abnormality. Mammographic images were processed with CAD.   Physical Exam: I palpate no abnormality in the central portions of the RIGHT breast.   Ultrasound: Targeted ultrasound is performed, showing normal appearing dense fibroglandular tissue in the 12 o'clock, retroareolar, and 6 o'clock locations of the RIGHT breast. No suspicious mass, distortion, or acoustic shadowing is demonstrated with ultrasound.   LEFT BREAST:   Mammogram:  There is focal asymmetry in the LATERAL retroareolar region of the LEFT breast, marked with a BB as palpable. Spot tangential view suggests possible distortion associated with a superficial mass in this region. Mammographic images were processed with CAD.   Physical Exam: I palpate a discrete oval mass in the 3 o'clock retroareolar region of the LEFT breast.   Ultrasound: Targeted ultrasound is performed, showing an irregular hypoechoic mass in the 3 o'clock retroareolar region of the LEFT breast, measuring approximately 0.9 x 0.7 x 0 7 centimeters. At real-time exam, there is question of associated distortion. Evaluation of the LEFT axilla is negative for adenopathy.   IMPRESSION: 1. Suspicious mass in the 3 o'clock retroareolar region of the LEFT breast for which biopsy is recommended. 2. LEFT axilla is negative.   RECOMMENDATION: Recommend ultrasound-guided core biopsy LEFT breast.   I have discussed the findings and recommendations with the patient. If applicable, a reminder letter will be sent to the patient regarding the next appointment.   BI-RADS CATEGORY 4: Suspicious.  Pathology core needle biopsy: 04/15/21 Breast, left, needle core biopsy, 3 o'clock retroareolar - INVASIVE DUCTAL CARCINOMA Based on the biopsy, the carcinoma appears Nottingham grade 2-3 of 3 and measures 0.7 cm in greatest linear extent.  Receptors: Estrogen Receptor: 100%, POSITIVE, STRONG STAINING INTENSITY Progesterone Receptor: 100%, POSITIVE, STRONG STAINING INTENSITY Proliferation Marker Ki67: 15% GROUP 5: HER2 **NEGATIVE**  Review of Systems: A complete review of systems was obtained from the patient. I have reviewed this information and discussed as appropriate with the patient. See HPI as well for other ROS.  Review of Systems  All other systems reviewed and are negative.  Medical History: Past Medical History:  Diagnosis Date   Thyroid disease   Patient Active Problem List   Diagnosis   Malignant neoplasm of upper-outer quadrant of left breast in female, estrogen receptor positive (CMS-HCC)   Family history of cancer   Past Surgical History:  Procedure Laterality Date   tear duct surgery 2009    No Known Allergies  Current Outpatient Medications on File Prior to Visit  Medication Sig Dispense Refill   fluticasone propionate (FLONASE) 50 mcg/actuation nasal spray fluticasone propionate 50 mcg/actuation nasal spray,suspension SHAKE LIQUID AND USE 1 SPRAY IN EACH NOSTRIL DAILY   cetirizine (ZYRTEC) 10 mg capsule Zyrtec 10 mg capsule Take by oral route.   levothyroxine (SYNTHROID) 75 MCG tablet Take 1 tablet (75 mcg total) by mouth once daily Take on an empty stomach with a glass of water at least 30-60 minutes before breakfast.   No current facility-administered medications on file prior to visit.   Family History  Problem Relation Age of Onset   High blood pressure (Hypertension) Mother   Diabetes Mother   Breast cancer Mother   Skin cancer Father   Hyperlipidemia (Elevated cholesterol) Father   Coronary Artery Disease (Blocked arteries around heart) Father   Diabetes Father   Coronary Artery Disease (Blocked arteries around heart) Brother   Hyperlipidemia (Elevated cholesterol) Brother   Skin cancer Brother    Social History   Tobacco Use  Smoking Status Never  Smokeless Tobacco Never    Social History   Socioeconomic History   Marital status: Married  Tobacco Use   Smoking status: Never   Smokeless tobacco: Never  Substance and Sexual Activity   Alcohol use: Never   Drug use: Never   Objective:   Vitals:  04/28/21 1003  BP: 122/80  Pulse: 80  Temp: 36.7 C (98 F)  SpO2: 97%  Weight: 68.8 kg (151 lb 9.6 oz)  Height: 162.6 cm (_0 )   Body mass index is 26.02 kg/m.  Gen: No acute distress. Well nourished and well groomed.  Neurological: Alert and oriented to person, place, and time. Coordination normal.  Head:  Normocephalic and atraumatic.  Eyes: Conjunctivae are normal. Pupils are equal, round, and reactive to light. No scleral icterus.  Neck: Normal range of motion. Neck supple. No tracheal deviation or thyromegaly present.  Cardiovascular: Normal rate, regular rhythm, normal heart sounds and intact distal pulses. Exam reveals no gallop and no friction rub. No murmur heard. Breast: small mass at 3 o'clock under the areola on the left. Palpable, but skin not involved. Mobile. Breast relatively symmetric. No LAD. No skin dimpling. No nipple retraction. No nipple discharge on either side.   Respiratory: Effort normal. No respiratory distress. No chest wall tenderness. Breath sounds normal. No wheezes, rales or rhonchi.  GI: Soft. Bowel sounds are normal. The abdomen is soft and nontender. There is no rebound and no guarding.  Musculoskeletal: Normal range of motion. Extremities are nontender.  Lymphadenopathy: No cervical, preauricular, postauricular or axillary adenopathy is present Skin: Skin is warm and dry. No rash noted. No diaphoresis. No erythema. No pallor. No clubbing, cyanosis, or edema.  Psychiatric: Normal mood and affect. Behavior is normal. Judgment and thought content normal.   Labs n/a  Assessment and Plan:   Malignant neoplasm of upper-outer quadrant of left breast in female, estrogen receptor positive (CMS-HCC) Pt has a new dx of a cT1bN0 hormone positive breast cancer. Will plan a lumpectomy and sentinel node biopsy. She will need genetics referral in  addition to medical and radiation oncology.   The surgical procedure was described to the patient. I discussed the incision type and location and that we would need radiology involved on with a wire or seed marker and/or sentinel node.   The risks and benefits of the procedure were described to the patient and she wishes to proceed.   We discussed the risks bleeding, infection, damage to other structures, need for further  procedures/surgeries. We discussed the risk of seroma. The patient was advised if the area in the breast in cancer, we may need to go back to surgery for additional tissue to obtain negative margins or for a lymph node biopsy. The patient was advised that these are the most common complications, but that others can occur as well. They were advised against taking aspirin or other anti-inflammatory agents/blood thinners the week before surgery.   Pt's husband is present for the visit.   Family history of cancer Genetic referral.  Return for breast cancer follow up.  Milus Height, MD FACS Surgical Oncology, General Surgery, Trauma and [facility] [place]

## 2021-05-08 ENCOUNTER — Ambulatory Visit (HOSPITAL_COMMUNITY)
Admission: RE | Admit: 2021-05-08 | Discharge: 2021-05-08 | Disposition: A | Discharge status: Home / Self Care | Payer: BC Managed Care – PPO | Attending: General Surgery | Admitting: General Surgery

## 2021-05-08 ENCOUNTER — Ambulatory Visit (HOSPITAL_COMMUNITY): Payer: BC Managed Care – PPO | Admitting: Anesthesiology

## 2021-05-08 ENCOUNTER — Other Ambulatory Visit: Payer: Self-pay

## 2021-05-08 ENCOUNTER — Encounter (HOSPITAL_COMMUNITY): Payer: Self-pay | Admitting: General Surgery

## 2021-05-08 ENCOUNTER — Encounter: Payer: Self-pay | Admitting: *Deleted

## 2021-05-08 ENCOUNTER — Encounter (HOSPITAL_COMMUNITY)
Admission: RE | Disposition: A | Discharge status: Home / Self Care | Payer: Self-pay | Source: Home / Self Care | Attending: General Surgery

## 2021-05-08 DIAGNOSIS — Z170 Estrogen receptor positive status [ER+]: Secondary | ICD-10-CM | POA: Diagnosis not present

## 2021-05-08 DIAGNOSIS — D0512 Intraductal carcinoma in situ of left breast: Secondary | ICD-10-CM | POA: Diagnosis not present

## 2021-05-08 DIAGNOSIS — C50412 Malignant neoplasm of upper-outer quadrant of left female breast: Secondary | ICD-10-CM | POA: Diagnosis present

## 2021-05-08 DIAGNOSIS — E039 Hypothyroidism, unspecified: Secondary | ICD-10-CM | POA: Diagnosis not present

## 2021-05-08 HISTORY — PX: BREAST LUMPECTOMY: SHX2

## 2021-05-08 HISTORY — PX: BREAST LUMPECTOMY WITH SENTINEL LYMPH NODE BIOPSY: SHX5597

## 2021-05-08 SURGERY — BREAST LUMPECTOMY WITH SENTINEL LYMPH NODE BX
Anesthesia: Regional | Site: Breast | Laterality: Left

## 2021-05-08 MED ORDER — FENTANYL CITRATE (PF) 250 MCG/5ML IJ SOLN
INTRAMUSCULAR | Status: AC
  Filled 2021-05-08: qty 5

## 2021-05-08 MED ORDER — OXYCODONE HCL 5 MG PO TABS: 5.0000 mg | ORAL_TABLET | Freq: Four times a day (QID) | ORAL | 0 refills | Status: DC | PRN

## 2021-05-08 MED ORDER — LACTATED RINGERS IV SOLN: INTRAVENOUS | Status: DC

## 2021-05-08 MED ORDER — PROPOFOL 500 MG/50ML IV EMUL
INTRAVENOUS | Status: DC | PRN
  Administered 2021-05-08: 125 ug/kg/min via INTRAVENOUS

## 2021-05-08 MED ORDER — CEFAZOLIN SODIUM-DEXTROSE 2-4 GM/100ML-% IV SOLN
2.0000 g | INTRAVENOUS | Status: AC
  Administered 2021-05-08: 14:00:00 2 g via INTRAVENOUS
  Filled 2021-05-08: qty 100

## 2021-05-08 MED ORDER — 0.9 % SODIUM CHLORIDE (POUR BTL) OPTIME
TOPICAL | Status: DC | PRN
  Administered 2021-05-08: 14:00:00 1000 mL

## 2021-05-08 MED ORDER — CHLORHEXIDINE GLUCONATE 0.12 % MT SOLN
15.0000 mL | Freq: Once | OROMUCOSAL | Status: AC
  Administered 2021-05-08: 12:00:00 15 mL via OROMUCOSAL
  Filled 2021-05-08: qty 15

## 2021-05-08 MED ORDER — DEXAMETHASONE SODIUM PHOSPHATE 10 MG/ML IJ SOLN
INTRAMUSCULAR | Status: DC | PRN
  Administered 2021-05-08: 5 mg

## 2021-05-08 MED ORDER — LIDOCAINE HCL (PF) 1 % IJ SOLN
INTRAMUSCULAR | Status: AC
  Filled 2021-05-08: qty 30

## 2021-05-08 MED ORDER — MIDAZOLAM HCL 2 MG/2ML IJ SOLN
INTRAMUSCULAR | Status: AC
  Filled 2021-05-08: qty 2

## 2021-05-08 MED ORDER — DEXAMETHASONE SODIUM PHOSPHATE 10 MG/ML IJ SOLN
INTRAMUSCULAR | Status: DC | PRN
  Administered 2021-05-08: 14:00:00 5 mg via INTRAVENOUS

## 2021-05-08 MED ORDER — ONDANSETRON HCL 4 MG/2ML IJ SOLN
INTRAMUSCULAR | Status: DC | PRN
  Administered 2021-05-08: 4 mg via INTRAVENOUS

## 2021-05-08 MED ORDER — ORAL CARE MOUTH RINSE: 15.0000 mL | Freq: Once | OROMUCOSAL | Status: AC

## 2021-05-08 MED ORDER — LIDOCAINE HCL 1 % IJ SOLN
INTRAMUSCULAR | Status: DC | PRN
  Administered 2021-05-08: 20 mL via INTRAMUSCULAR

## 2021-05-08 MED ORDER — FENTANYL CITRATE (PF) 100 MCG/2ML IJ SOLN
50.0000 ug | Freq: Once | INTRAMUSCULAR | Status: AC
  Administered 2021-05-08: 13:00:00 50 ug via INTRAVENOUS

## 2021-05-08 MED ORDER — PROPOFOL 10 MG/ML IV BOLUS
INTRAVENOUS | Status: DC | PRN
  Administered 2021-05-08 (×2): 30 mg via INTRAVENOUS
  Administered 2021-05-08: 150 mg via INTRAVENOUS

## 2021-05-08 MED ORDER — ACETAMINOPHEN 500 MG PO TABS
1000.0000 mg | ORAL_TABLET | ORAL | Status: AC
  Administered 2021-05-08: 12:00:00 1000 mg via ORAL
  Filled 2021-05-08: qty 2

## 2021-05-08 MED ORDER — BUPIVACAINE-EPINEPHRINE (PF) 0.25% -1:200000 IJ SOLN
INTRAMUSCULAR | Status: AC
  Filled 2021-05-08: qty 30

## 2021-05-08 MED ORDER — MIDAZOLAM HCL 2 MG/2ML IJ SOLN
INTRAMUSCULAR | Status: DC | PRN
  Administered 2021-05-08: 2 mg via INTRAVENOUS

## 2021-05-08 MED ORDER — PHENYLEPHRINE 40 MCG/ML (10ML) SYRINGE FOR IV PUSH (FOR BLOOD PRESSURE SUPPORT)
PREFILLED_SYRINGE | INTRAVENOUS | Status: DC | PRN
  Administered 2021-05-08: 120 ug via INTRAVENOUS

## 2021-05-08 MED ORDER — APREPITANT 40 MG PO CAPS
ORAL_CAPSULE | ORAL | Status: AC
  Filled 2021-05-08: qty 1

## 2021-05-08 MED ORDER — SCOPOLAMINE 1 MG/3DAYS TD PT72
1.0000 | MEDICATED_PATCH | TRANSDERMAL | Status: DC
  Administered 2021-05-08: 13:00:00 1.5 mg via TRANSDERMAL

## 2021-05-08 MED ORDER — APREPITANT 40 MG PO CAPS
40.0000 mg | ORAL_CAPSULE | Freq: Once | ORAL | Status: AC
  Administered 2021-05-08: 13:00:00 40 mg via ORAL

## 2021-05-08 MED ORDER — ROPIVACAINE HCL 5 MG/ML IJ SOLN
INTRAMUSCULAR | Status: DC | PRN
  Administered 2021-05-08: 30 mL via PERINEURAL

## 2021-05-08 MED ORDER — FENTANYL CITRATE (PF) 100 MCG/2ML IJ SOLN
INTRAMUSCULAR | Status: AC
  Filled 2021-05-08: qty 2

## 2021-05-08 MED ORDER — LIDOCAINE 2% (20 MG/ML) 5 ML SYRINGE
INTRAMUSCULAR | Status: DC | PRN
  Administered 2021-05-08: 40 mg via INTRAVENOUS

## 2021-05-08 MED ORDER — MIDAZOLAM HCL 2 MG/2ML IJ SOLN: 2.0000 mg | Freq: Once | INTRAMUSCULAR | Status: DC

## 2021-05-08 MED ORDER — ENSURE PRE-SURGERY PO LIQD: 296.0000 mL | Freq: Once | ORAL | Status: DC

## 2021-05-08 MED ORDER — SCOPOLAMINE 1 MG/3DAYS TD PT72
MEDICATED_PATCH | TRANSDERMAL | Status: AC
  Filled 2021-05-08: qty 1

## 2021-05-08 MED ORDER — CHLORHEXIDINE GLUCONATE CLOTH 2 % EX PADS: 6.0000 | MEDICATED_PAD | Freq: Once | CUTANEOUS | Status: DC

## 2021-05-08 MED ORDER — PROPOFOL 10 MG/ML IV BOLUS
INTRAVENOUS | Status: AC
  Filled 2021-05-08: qty 20

## 2021-05-08 MED ORDER — MAGTRACE LYMPHATIC TRACER
INTRAMUSCULAR | Status: DC | PRN
  Administered 2021-05-08: 14:00:00 2 mL via INTRAMUSCULAR

## 2021-05-08 MED ORDER — FENTANYL CITRATE (PF) 100 MCG/2ML IJ SOLN: 25.0000 ug | INTRAMUSCULAR | Status: DC | PRN

## 2021-05-08 SURGICAL SUPPLY — 45 items
BAG COUNTER SPONGE SURGICOUNT (BAG) ×2 IMPLANT
BINDER BREAST LRG (GAUZE/BANDAGES/DRESSINGS) ×2 IMPLANT
BINDER BREAST XLRG (GAUZE/BANDAGES/DRESSINGS) IMPLANT
BNDG COHESIVE 4X5 TAN STRL (GAUZE/BANDAGES/DRESSINGS) ×2 IMPLANT
CANISTER SUCT 3000ML PPV (MISCELLANEOUS) ×2 IMPLANT
CHLORAPREP W/TINT 26 (MISCELLANEOUS) ×2 IMPLANT
CLIP VESOCCLUDE LG 6/CT (CLIP) ×2 IMPLANT
CLIP VESOCCLUDE MED 24/CT (CLIP) ×2 IMPLANT
CNTNR URN SCR LID CUP LEK RST (MISCELLANEOUS) IMPLANT
CONT SPEC 4OZ STRL OR WHT (MISCELLANEOUS)
COVER PROBE W GEL 5X96 (DRAPES) ×2 IMPLANT
COVER SURGICAL LIGHT HANDLE (MISCELLANEOUS) ×2 IMPLANT
DERMABOND ADVANCED (GAUZE/BANDAGES/DRESSINGS) ×1
DERMABOND ADVANCED .7 DNX12 (GAUZE/BANDAGES/DRESSINGS) ×1 IMPLANT
DEVICE DUBIN SPECIMEN MAMMOGRA (MISCELLANEOUS) IMPLANT
DRAPE CHEST BREAST 15X10 FENES (DRAPES) ×2 IMPLANT
DRAPE SURG 17X23 STRL (DRAPES) IMPLANT
DRSG PAD ABDOMINAL 8X10 ST (GAUZE/BANDAGES/DRESSINGS) ×2 IMPLANT
ELECT COATED BLADE 2.86 ST (ELECTRODE) ×2 IMPLANT
ELECT NEEDLE BLADE 2-5/6 (NEEDLE) ×2 IMPLANT
ELECT REM PT RETURN 9FT ADLT (ELECTROSURGICAL) ×2
ELECTRODE REM PT RTRN 9FT ADLT (ELECTROSURGICAL) ×1 IMPLANT
GAUZE SPONGE 4X4 12PLY STRL (GAUZE/BANDAGES/DRESSINGS) ×2 IMPLANT
GLOVE SURG ENC MOIS LTX SZ6 (GLOVE) ×2 IMPLANT
GLOVE SURG UNDER LTX SZ6.5 (GLOVE) ×2 IMPLANT
GOWN STRL REUS W/ TWL LRG LVL3 (GOWN DISPOSABLE) ×1 IMPLANT
GOWN STRL REUS W/TWL 2XL LVL3 (GOWN DISPOSABLE) ×2 IMPLANT
GOWN STRL REUS W/TWL LRG LVL3 (GOWN DISPOSABLE) ×2
KIT BASIN OR (CUSTOM PROCEDURE TRAY) ×2 IMPLANT
KIT MARKER MARGIN INK (KITS) ×2 IMPLANT
LIGHT WAVEGUIDE WIDE FLAT (MISCELLANEOUS) IMPLANT
NEEDLE 18GX1X1/2 (RX/OR ONLY) (NEEDLE) IMPLANT
NEEDLE FILTER BLUNT 18X 1/2SAF (NEEDLE)
NEEDLE FILTER BLUNT 18X1 1/2 (NEEDLE) IMPLANT
NEEDLE HYPO 25GX1X1/2 BEV (NEEDLE) ×2 IMPLANT
NS IRRIG 1000ML POUR BTL (IV SOLUTION) ×2 IMPLANT
PACK GENERAL/GYN (CUSTOM PROCEDURE TRAY) ×2 IMPLANT
PACK UNIVERSAL I (CUSTOM PROCEDURE TRAY) ×2 IMPLANT
STOCKINETTE IMPERVIOUS 9X36 MD (GAUZE/BANDAGES/DRESSINGS) ×2 IMPLANT
SUT MNCRL AB 4-0 PS2 18 (SUTURE) ×2 IMPLANT
SUT VIC AB 3-0 SH 8-18 (SUTURE) ×2 IMPLANT
SYR CONTROL 10ML LL (SYRINGE) ×2 IMPLANT
TOWEL GREEN STERILE (TOWEL DISPOSABLE) ×2 IMPLANT
TOWEL GREEN STERILE FF (TOWEL DISPOSABLE) ×2 IMPLANT
TRACER MAGTRACE VIAL (MISCELLANEOUS) ×2 IMPLANT

## 2021-05-08 NOTE — Discharge Instructions (Addendum)
[facility] Office Phone Number [phone]  BREAST BIOPSY/ PARTIAL MASTECTOMY: POST OP INSTRUCTIONS  Always review your discharge instruction sheet given to you by the facility where your surgery was performed.  IF YOU HAVE DISABILITY OR FAMILY LEAVE FORMS, YOU MUST BRING THEM TO THE OFFICE FOR PROCESSING.  DO NOT GIVE THEM TO YOUR DOCTOR.  A prescription for pain medication may be given to you upon discharge.  Take your pain medication as prescribed, if needed.  If narcotic pain medicine is not needed, then you may take acetaminophen (Tylenol) or ibuprofen (Advil) as needed. Take your usually prescribed medications unless otherwise directed If you need a refill on your pain medication, please contact your pharmacy.  They will contact our office to request authorization.  Prescriptions will not be filled after 5pm or on week-ends. You should eat very light the first 24 hours after surgery, such as soup, crackers, pudding, etc.  Resume your normal diet the day after surgery. Most patients will experience some swelling and bruising in the breast.  Ice packs and a good support bra will help.  Swelling and bruising can take several days to resolve.  It is common to experience some constipation if taking pain medication after surgery.  Increasing fluid intake and taking a stool softener will usually help or prevent this problem from occurring.  A mild laxative (Milk of Magnesia or Miralax) should be taken according to package directions if there are no bowel movements after 48 hours. Unless discharge instructions indicate otherwise, you may remove your bandages 48 hours after surgery, and you may shower at that time.  You may have steri-strips (small skin tapes) in place directly over the incision.  These strips should be left on the skin for 7-10 days.   Any sutures or staples will be removed at the office during your follow-up visit. ACTIVITIES:  You may resume regular daily activities  (gradually increasing) beginning the next day.  Wearing a good support bra or sports bra (or the breast binder) minimizes pain and swelling.  You may have sexual intercourse when it is comfortable. You may drive when you no longer are taking prescription pain medication, you can comfortably wear a seatbelt, and you can safely maneuver your car and apply brakes. RETURN TO WORK:  __________1 week_______________ You should see your doctor in the office for a follow-up appointment approximately two weeks after your surgery.  Your doctor's nurse will typically make your follow-up appointment when she calls you with your pathology report.  Expect your pathology report 2-3 business days after your surgery.  You may call to check if you do not hear from us after three days.   WHEN TO CALL YOUR DOCTOR: Fever over 101.0 Nausea and/or vomiting. Extreme swelling or bruising. Continued bleeding from incision. Increased pain, redness, or drainage from the incision.  The clinic staff is available to answer your questions during regular business hours.  Please don't hesitate to call and ask to speak to one of the nurses for clinical concerns.  If you have a medical emergency, go to the nearest emergency room or call 911.  A surgeon from [facility] is always on call at the hospital.  For further questions, please visit [email or web address]   

## 2021-05-08 NOTE — Anesthesia Preprocedure Evaluation (Addendum)
Anesthesia Evaluation  Patient identified by MRN, date of birth, ID band Patient awake    Reviewed: Allergy & Precautions, NPO status , Patient's Chart, lab work & pertinent test results  History of Anesthesia Complications (+) PONV  Airway Mallampati: III  TM Distance: >3 FB Neck ROM: Full  Mouth opening: Limited Mouth Opening  Dental no notable dental hx. (+) Teeth Intact, Dental Advisory Given   Pulmonary neg pulmonary ROS,    Pulmonary exam normal breath sounds clear to auscultation       Cardiovascular negative cardio ROS Normal cardiovascular exam Rhythm:Regular Rate:Normal     Neuro/Psych negative neurological ROS  negative psych ROS   GI/Hepatic negative GI ROS, Neg liver ROS,   Endo/Other  Hypothyroidism   Renal/GU negative Renal ROS  negative genitourinary   Musculoskeletal negative musculoskeletal ROS (+)   Abdominal   Peds  Hematology negative hematology ROS (+)   Anesthesia Other Findings Left breast CA  Reproductive/Obstetrics                            Anesthesia Physical Anesthesia Plan  ASA: 2  Anesthesia Plan: General and Regional   Post-op Pain Management:    Induction: Intravenous  PONV Risk Score and Plan: 4 or greater and Ondansetron, Dexamethasone, Midazolam, Scopolamine patch - Pre-op, Aprepitant and TIVA  Airway Management Planned: LMA  Additional Equipment:   Intra-op Plan:   Post-operative Plan: Extubation in OR  Informed Consent: I have reviewed the patients History and Physical, chart, labs and discussed the procedure including the risks, benefits and alternatives for the proposed anesthesia with the patient or authorized representative who has indicated his/her understanding and acceptance.     Dental advisory given  Plan Discussed with: CRNA  Anesthesia Plan Comments:       Anesthesia Quick Evaluation

## 2021-05-08 NOTE — Transfer of Care (Signed)
Immediate Anesthesia Transfer of Care Note  Patient: Sheila Bryant  Procedure(s) Performed: LEFT BREAST LUMPECTOMY WITH SENTINEL LYMPH NODE BX (Left: Breast)  Patient Location: PACU  Anesthesia Type:MAC combined with regional for post-op pain  Level of Consciousness: awake, alert  and patient cooperative  Airway & Oxygen Therapy: Patient Spontanous Breathing  Post-op Assessment: Report given to RN, Post -op Vital signs reviewed and stable and Patient moving all extremities  Post vital signs: Reviewed and stable  Last Vitals:  Vitals Value Taken Time  BP 158/83   Temp    Pulse 62 05/08/21 1509  Resp 11 05/08/21 1509  SpO2 97 % 05/08/21 1509  Vitals shown include unvalidated device data.  Last Pain:  Vitals:   05/08/21 1156  TempSrc:   PainSc: 0-No pain         Complications: No notable events documented.

## 2021-05-08 NOTE — Interval H&P Note (Signed)
History and Physical Interval Note:  05/08/2021 1:04 PM  Sheila Bryant  has presented today for surgery, with the diagnosis of LEFT BREAST CANCER.  The various methods of treatment have been discussed with the patient and family. After consideration of risks, benefits and other options for treatment, the patient has consented to  Procedure(s): LEFT BREAST LUMPECTOMY WITH SENTINEL LYMPH NODE BX (Left) as a surgical intervention.  The patient's history has been reviewed, patient examined, no change in status, stable for surgery.  I have reviewed the patient's chart and labs.  Questions were answered to the patient's satisfaction.     Stark Klein

## 2021-05-08 NOTE — Anesthesia Procedure Notes (Signed)
Anesthesia Regional Block: Pectoralis block   Pre-Anesthetic Checklist: , timeout performed,  Correct Patient, Correct Site, Correct Laterality,  Correct Procedure, Correct Position, site marked,  Risks and benefits discussed,  Surgical consent,  Pre-op evaluation,  At surgeon's request and post-op pain management  Laterality: Left  Prep: Maximum Sterile Barrier Precautions used, chloraprep       Needles:  Injection technique: Single-shot  Needle Type: Echogenic Stimulator Needle     Needle Length: 9cm  Needle Gauge: 22     Additional Needles:   Procedures:,,,, ultrasound used (permanent image in chart),,    Narrative:  Start time: 05/08/2021 1:05 PM End time: 05/08/2021 1:10 PM Injection made incrementally with aspirations every 5 mL.  Performed by: Personally  Anesthesiologist: Freddrick March, MD  Additional Notes: Monitors applied. No increased pain on injection. No increased resistance to injection. Injection made in 5cc increments. Good needle visualization. Patient tolerated procedure well.

## 2021-05-08 NOTE — Op Note (Signed)
Left Breast Lumpectomy with Sentinel Node Mapping and Biopsy  Indications: This patient presents with history of left breast cancer with clinically negative axillary lymph node exam.  Pre-operative Diagnosis: left breast cancer, upper outer quadrant, grade 3 invasive ductal carcinoma ER+/PR+/Her 2-, cT1bN0  Post-operative Diagnosis: left breast cancer  Surgeon: Stark Klein   Assistants: Izola Price, RNFA  Anesthesia: General LMA anesthesia  ASA Class: 2  Procedure Details  The patient was seen in the Holding Room. The risks, benefits, complications, treatment options, and expected outcomes were discussed with the patient. The possibilities of reaction to medication, pulmonary aspiration, bleeding, infection, the need for additional procedures, failure to diagnose a condition, and creating a complication requiring transfusion or operation were discussed with the patient. The patient concurred with the proposed plan, giving informed consent.  The site of surgery properly noted/marked. The patient was taken to Operating Room # 2, identified as [name] and the procedure verified as left Breast Lumpectomy and Sentinel Node Biopsy. A Time Out was held and the above information confirmed. MagTrace was injected in the subareolar space.    After induction of anesthesia, the left arm, breast, and chest were prepped and draped in standard fashion.  The lumpectomy was performed by creating a   incision near the mass.  Skin hooks were used to elevate the skin and the metzenbaum scissors were used to dissect under the nipple and the cautery was used to dissect around the mass.  Dissection was carried down to the pectoral fascia.  The specimen was marked with the margin marker paint kit.  The specimen was placed in the faxatron to confirm presence on the biopsy clip.  Hemostasis was achieved with cautery.  Large clips were placed on the specimen cavity edges for radiation.  The wound was irrigated and  closed with a 3-0 Vicryl deep dermal interrupted and a 4-0 Monocryl subcuticular closure in layers.  Using a hand-held gamma probe, axillary sentinel nodes were identified transcutaneously.  An oblique incision was created below the axillary hairline.  Dissection was carried through the clavipectoral fascia.  1 level 2 axillary sentinel nodes were removed. Lymphovascular channels were clipped.  The wound was irrigated.  Hemostasis was achieved with cautery and clips.  The axillary incision was closed with 3-0 vicryl deep dermal interrupted sutures and 4-0 monocryl subcuticular closure in layers.      Sterile dressings were applied. At the end of the operation, all sponge, instrument, and needle counts were correct.  Findings: grossly clear surgical margins, anterior margin is skin  Estimated Blood Loss:  Minimal         Specimens: left breast lumpectomy, left axillary sentinel node #1                Complications:  None; patient tolerated the procedure well.         Disposition: PACU - hemodynamically stable.         Condition: stable

## 2021-05-08 NOTE — Progress Notes (Signed)
Orthopedic Tech Progress Note Patient Details:  Sheila Bryant 1956/03/03 [id number]  Ortho Devices Type of Ortho Device: Postop shoe/boot Ortho Device/Splint Interventions: Ordered      Danton Sewer A Sheila Bryant 05/08/2021, 3:35 PM

## 2021-05-08 NOTE — Anesthesia Procedure Notes (Signed)
Procedure Name: LMA Insertion Date/Time: 05/08/2021 1:32 PM Performed by: Rande Brunt, CRNA Pre-anesthesia Checklist: Patient identified, Emergency Drugs available, Suction available and Patient being monitored Patient Re-evaluated:Patient Re-evaluated prior to induction Oxygen Delivery Method: Circle System Utilized Preoxygenation: Pre-oxygenation with 100% oxygen Induction Type: IV induction Ventilation: Mask ventilation without difficulty LMA: LMA inserted LMA Size: 4.0 Number of attempts: 1 Placement Confirmation: positive ETCO2 Tube secured with: Tape Dental Injury: Teeth and Oropharynx as per pre-operative assessment

## 2021-05-09 ENCOUNTER — Encounter (HOSPITAL_COMMUNITY): Payer: Self-pay | Admitting: General Surgery

## 2021-05-10 NOTE — Anesthesia Postprocedure Evaluation (Signed)
Anesthesia Post Note  Patient: Sheila Bryant  Procedure(s) Performed: LEFT BREAST LUMPECTOMY WITH SENTINEL LYMPH NODE BX (Left: Breast)     Patient location during evaluation: PACU Anesthesia Type: Regional and General Level of consciousness: awake and alert Pain management: pain level controlled Vital Signs Assessment: post-procedure vital signs reviewed and stable Respiratory status: spontaneous breathing, nonlabored ventilation and respiratory function stable Cardiovascular status: blood pressure returned to baseline and stable Postop Assessment: no apparent nausea or vomiting Anesthetic complications: no   No notable events documented.  Last Vitals:  Vitals:   05/08/21 1526 05/08/21 1543  BP: (!) 148/88 (!) 162/88  Pulse: (!) 58 (!) 55  Resp: 15 13  Temp:  36.4 C  SpO2: 98% 100%    Last Pain:  Vitals:   05/08/21 1543  TempSrc:   PainSc: 0-No pain                 Lynda Rainwater

## 2021-05-12 LAB — SURGICAL PATHOLOGY

## 2021-05-14 ENCOUNTER — Encounter (HOSPITAL_COMMUNITY): Payer: Self-pay | Admitting: General Surgery

## 2021-05-21 ENCOUNTER — Telehealth: Payer: Self-pay | Admitting: *Deleted

## 2021-05-21 ENCOUNTER — Encounter: Payer: Self-pay | Admitting: *Deleted

## 2021-05-21 NOTE — Telephone Encounter (Signed)
Ordered oncotype per Dr. Feng. Faxed requisition to pathology [facility]. °

## 2021-05-30 ENCOUNTER — Telehealth: Payer: Self-pay | Admitting: *Deleted

## 2021-05-30 ENCOUNTER — Encounter: Payer: Self-pay | Admitting: *Deleted

## 2021-05-30 DIAGNOSIS — Z170 Estrogen receptor positive status [ER+]: Secondary | ICD-10-CM

## 2021-05-30 NOTE — Telephone Encounter (Signed)
Received oncotype results of 7/3%. Patient is aware. Referral placed for Dr. Lisbeth Renshaw.

## 2021-06-02 ENCOUNTER — Encounter (HOSPITAL_COMMUNITY): Payer: Self-pay

## 2021-06-03 ENCOUNTER — Encounter: Payer: Self-pay | Admitting: *Deleted

## 2021-06-09 ENCOUNTER — Telehealth: Payer: Self-pay

## 2021-06-09 NOTE — Telephone Encounter (Addendum)
Patient notified of her 9:00am-06/10/21 in-person appointment w/ Shona Simpson PA-C. Patient instructed to arrive 15-20 min early for appointment check-in. Patient verbalized understanding.

## 2021-06-10 ENCOUNTER — Encounter: Payer: Self-pay | Admitting: Radiation Oncology

## 2021-06-10 ENCOUNTER — Ambulatory Visit
Admission: RE | Admit: 2021-06-10 | Discharge: 2021-06-10 | Disposition: A | Discharge status: Home / Self Care | Payer: BC Managed Care – PPO | Source: Ambulatory Visit | Attending: Radiation Oncology | Admitting: Radiation Oncology

## 2021-06-10 ENCOUNTER — Other Ambulatory Visit: Payer: Self-pay

## 2021-06-10 VITALS — BP 166/88 | HR 61 | Temp 97.7°F | Resp 18 | Ht 64.0 in | Wt 154.6 lb

## 2021-06-10 DIAGNOSIS — E039 Hypothyroidism, unspecified: Secondary | ICD-10-CM | POA: Insufficient documentation

## 2021-06-10 DIAGNOSIS — Z79899 Other long term (current) drug therapy: Secondary | ICD-10-CM | POA: Insufficient documentation

## 2021-06-10 DIAGNOSIS — Z8052 Family history of malignant neoplasm of bladder: Secondary | ICD-10-CM | POA: Insufficient documentation

## 2021-06-10 DIAGNOSIS — Z803 Family history of malignant neoplasm of breast: Secondary | ICD-10-CM | POA: Diagnosis not present

## 2021-06-10 DIAGNOSIS — Z808 Family history of malignant neoplasm of other organs or systems: Secondary | ICD-10-CM | POA: Insufficient documentation

## 2021-06-10 DIAGNOSIS — Z170 Estrogen receptor positive status [ER+]: Secondary | ICD-10-CM | POA: Insufficient documentation

## 2021-06-10 DIAGNOSIS — C50012 Malignant neoplasm of nipple and areola, left female breast: Secondary | ICD-10-CM | POA: Insufficient documentation

## 2021-06-10 NOTE — Progress Notes (Signed)
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   HISTORY OF PRESENT ILLNESS: Sheila Bryant is a 65 y.o. female with a new diagnosis of left breast cancer.  The patient initially palpated a mass in the left breast and diagnostic imaging revealed a mass in the 3 o'clock position retroareolar measuring up to 9 mm. Her left axilla was negative for adenopathy. Additional evaluation of the right breast was also done due to possible finding on diagnostic mammogram for her left breast finding. This right ultrasound showed no mass or abnormality. 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    Since her last visit the patient has undergone left lumpectomy with sentinel lymph node biopsy on 05/08/2021.  This revealed a grade 2 invasive ductal carcinoma measuring 1.3 cm with associated intermediate grade DCIS.  Her margins were negative with the closest being 2 mm to the anterior margin for invasive disease and less than 1 mm anteriorly for noninvasive disease.  A single lymph node was negative for carcinoma.  Her tumor was tested for Oncotype DX and her recurrence risk score risk was 7.  No systemic chemotherapy is planned.  She is seen today to discuss adjuvant radiotherapy.   PREVIOUS RADIATION THERAPY: No   PAST MEDICAL HISTORY:  Past Medical History:  Diagnosis Date   Cancer ([place]) 04/15/2021   left breast   Hypothyroidism    PONV (postoperative nausea and vomiting)        PAST SURGICAL HISTORY: Past Surgical History:  Procedure Laterality Date    BREAST LUMPECTOMY WITH SENTINEL LYMPH NODE BIOPSY Left 05/08/2021   Procedure: LEFT BREAST LUMPECTOMY WITH SENTINEL LYMPH NODE BX;  Surgeon: Stark Klein, MD;  Location: [facility];  Service: General;  Laterality: Left;   CESAREAN SECTION     x2 (1987 and 1990)   EYE SURGERY Right 2008   clogged tear duct   WISDOM TOOTH EXTRACTION  1977     FAMILY HISTORY:  Family History  Problem Relation Age of Onset   Breast cancer Mother 59   Cancer Mother 59       breast cancer   COPD Mother    Heart disease Mother    Diabetes Father    Heart disease Father    Cancer Brother        bladder cancer   Cancer Maternal Aunt 24       breast cancer   Cancer Paternal Aunt        bladder cancer   Cancer Maternal Grandmother        uterine cancer   ADD / ADHD Daughter    ADD / ADHD Son      SOCIAL HISTORY:  reports that she has never smoked. She has never used smokeless tobacco. She reports current alcohol use. She reports that she does not use drugs. The patient is married and lives in [place]. 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  ALLERGIES: Tape   MEDICATIONS:  Current Outpatient Medications  Medication Sig Dispense Refill   cetirizine (ZYRTEC) 10 MG tablet Take 10 mg by mouth daily.     fluticasone (FLONASE) 50 MCG/ACT nasal spray Place 1 spray into both nostrils daily.     meclizine (ANTIVERT) 25 MG tablet Take 25 mg by mouth 3 (three) times daily as needed for dizziness.     oxyCODONE (OXY IR/ROXICODONE) 5 MG immediate release tablet Take 1 tablet (5 mg total) by mouth every 6 (six) hours as needed for severe pain. 10 tablet 0   SYNTHROID 75 MCG tablet Take 75 mcg by mouth daily before breakfast.     No current facility-administered medications for this encounter.     REVIEW OF SYSTEMS: On review of systems, the patient reports that she is doing well overall.  She did get treated for a superficial cellulitis along  her axillary incision.  She states she has 2 more days of antibiotics but that the redness has resolved.  She is looking forward to spending time with her family for the Christmas holiday.  No other complaints are verbalized.    PHYSICAL EXAM:  Wt Readings from Last 3 Encounters:  05/08/21 155 lb (70.3 kg)  05/06/21 156 lb 4.8 oz (70.9 kg)  05/01/21 155 lb 4.8 oz (70.4 kg)   Temp Readings from Last 3 Encounters:  05/08/21 97.6 F (36.4 C)  05/06/21 97.8 F (36.6 C) (Temporal)  05/01/21 97.7 F (36.5 C) (Oral)   BP Readings from Last 3 Encounters:  05/08/21 (!) 162/88  05/06/21 139/71  05/01/21 (!) 142/88   Pulse Readings from Last 3 Encounters:  05/08/21 (!) 55  05/06/21 80  05/01/21 81    In general this is a well appearing caucasian female in no acute distress. She's alert and oriented x4 and appropriate throughout the examination. Cardiopulmonary assessment is negative for acute distress and she exhibits normal effort.  Her left breast incision site is well-healed with minimal bruising medial to the incision line.  In the axillary incision there is some erythema medially to the incision but no evidence of cellulitic streaking.    ECOG = 1  0 - Asymptomatic (Fully active, able to carry on all predisease activities without restriction)  1 - Symptomatic but completely ambulatory (Restricted in physically strenuous activity but ambulatory and able to carry out work of a light or sedentary nature. For example, light housework, office work)  2 - Symptomatic, <50% in bed during the day (Ambulatory and capable of all self care but unable to carry out any work activities. Up and about more than 50% of waking hours)  3 - Symptomatic, >50% in bed, but not bedbound (Capable of only limited self-care, confined to bed or chair 50% or more of waking hours)  4 - Bedbound (Completely disabled. Cannot carry on any self-care. Totally confined to bed or chair)  5 - Death   Eustace Pen MM,  Creech RH, Tormey DC, et al. [phone]). "Toxicity and response criteria of the [facility]". [place] Oncol. 5 (6): 649-55    LABORATORY DATA:  Lab Results  Component Value Date   WBC 6.1 05/01/2021   HGB 14.8 05/01/2021   HCT 44.1 05/01/2021   MCV 90.0 05/01/2021   PLT 265 05/01/2021   No results found for: NA, K, CL, CO2 No results found for: ALT, AST, GGT, ALKPHOS, BILITOT    RADIOGRAPHY: No results found.     IMPRESSION/PLAN: 1. Stage IA,  pT1cN0M0, grade 2, ER/PR positive invasive ductal carcinoma of the left breast. Dr. Lisbeth Renshaw discusses the final pathology findings and reviews the nature of early stage left breast disease.  She has done well since her surgery and fortunately no chemotherapy has been recommended.  She is now ready to proceed with adjuvant external radiotherapy to the breast  to reduce risks of local recurrence followed by antiestrogen therapy. We discussed the risks, benefits, short, and long term effects of radiotherapy, as well as the curative intent, and the patient is interested in proceeding. Dr. Lisbeth Renshaw discusses the delivery and logistics of radiotherapy and recommends 4 weeks of radiotherapy to the left breast with deep inspiration breath hold technique. Written consent is obtained and placed in the chart, a copy was provided to the patient. She will simulate today.   In a visit lasting 45 minutes, greater than 50% of the time was spent face to face reviewing her case, as well as in preparation of, discussing, and coordinating the patient's care.  The above documentation reflects my direct findings during this shared patient visit. Please see the separate note by Dr. Lisbeth Renshaw on this date for the remainder of the patient's plan of care.    Carola Rhine, [facility]    **Disclaimer: This note was dictated with voice recognition software. Similar sounding words can inadvertently be transcribed and this note may contain transcription errors  which may not have been corrected upon publication of note.**

## 2021-06-10 NOTE — Progress Notes (Signed)
Patient reports LT breast redness and tenderness 2/10. No other symptoms reported at this time.   Meaningful use complete.  Postmenopausal- NO chances of pregnancy  BP (!) 166/88 (BP Location: Right Arm, Patient Position: Sitting, Cuff Size: Normal)    Pulse 61    Temp 97.7 F (36.5 C) (Temporal)    Resp 18    Ht 5\' 4"  (1.626 m)    Wt 154 lb 9.6 oz (70.1 kg)    SpO2 100%    BMI 26.54 kg/m

## 2021-06-11 ENCOUNTER — Encounter: Payer: Self-pay | Admitting: Hematology

## 2021-06-23 DIAGNOSIS — Z170 Estrogen receptor positive status [ER+]: Secondary | ICD-10-CM | POA: Diagnosis present

## 2021-06-23 DIAGNOSIS — C50012 Malignant neoplasm of nipple and areola, left female breast: Secondary | ICD-10-CM | POA: Insufficient documentation

## 2021-06-24 ENCOUNTER — Other Ambulatory Visit: Payer: Self-pay

## 2021-06-24 ENCOUNTER — Ambulatory Visit
Admission: RE | Admit: 2021-06-24 | Discharge: 2021-06-24 | Disposition: A | Discharge status: Home / Self Care | Payer: BC Managed Care – PPO | Source: Ambulatory Visit | Attending: Radiation Oncology | Admitting: Radiation Oncology

## 2021-06-24 ENCOUNTER — Telehealth: Payer: Self-pay | Admitting: Genetic Counselor

## 2021-06-24 DIAGNOSIS — C50012 Malignant neoplasm of nipple and areola, left female breast: Secondary | ICD-10-CM | POA: Diagnosis not present

## 2021-06-24 NOTE — Telephone Encounter (Signed)
Scheduled appt per 12/30 staff msg. Pt is aware of appt date and time.

## 2021-06-25 ENCOUNTER — Ambulatory Visit
Admission: RE | Admit: 2021-06-25 | Discharge: 2021-06-25 | Disposition: A | Discharge status: Home / Self Care | Payer: BC Managed Care – PPO | Source: Ambulatory Visit | Attending: Radiation Oncology | Admitting: Radiation Oncology

## 2021-06-25 ENCOUNTER — Encounter: Payer: Self-pay | Admitting: *Deleted

## 2021-06-25 DIAGNOSIS — C50012 Malignant neoplasm of nipple and areola, left female breast: Secondary | ICD-10-CM | POA: Diagnosis not present

## 2021-06-26 ENCOUNTER — Telehealth: Payer: Self-pay | Admitting: Hematology

## 2021-06-26 ENCOUNTER — Other Ambulatory Visit: Payer: Self-pay

## 2021-06-26 ENCOUNTER — Ambulatory Visit
Admission: RE | Admit: 2021-06-26 | Discharge: 2021-06-26 | Disposition: A | Discharge status: Home / Self Care | Payer: BC Managed Care – PPO | Source: Ambulatory Visit | Attending: Radiation Oncology | Admitting: Radiation Oncology

## 2021-06-26 DIAGNOSIS — C50012 Malignant neoplasm of nipple and areola, left female breast: Secondary | ICD-10-CM | POA: Diagnosis not present

## 2021-06-26 NOTE — Telephone Encounter (Signed)
Scheduled per 01/05 scheduled message, patient has been called and notified. °

## 2021-06-27 ENCOUNTER — Ambulatory Visit
Admission: RE | Admit: 2021-06-27 | Discharge: 2021-06-27 | Disposition: A | Discharge status: Home / Self Care | Payer: BC Managed Care – PPO | Source: Ambulatory Visit | Attending: Radiation Oncology | Admitting: Radiation Oncology

## 2021-06-27 DIAGNOSIS — C50012 Malignant neoplasm of nipple and areola, left female breast: Secondary | ICD-10-CM | POA: Diagnosis not present

## 2021-06-27 DIAGNOSIS — Z170 Estrogen receptor positive status [ER+]: Secondary | ICD-10-CM

## 2021-06-27 MED ORDER — ALRA NON-METALLIC DEODORANT (RAD-ONC)
1.0000 "application " | Freq: Once | TOPICAL | Status: AC
  Administered 2021-06-27: 1 via TOPICAL

## 2021-06-27 MED ORDER — RADIAPLEXRX EX GEL
1.0000 "application " | Freq: Once | CUTANEOUS | Status: AC
  Administered 2021-06-27: 1 via TOPICAL

## 2021-06-30 ENCOUNTER — Other Ambulatory Visit: Payer: Self-pay

## 2021-06-30 ENCOUNTER — Ambulatory Visit
Admission: RE | Admit: 2021-06-30 | Discharge: 2021-06-30 | Disposition: A | Discharge status: Home / Self Care | Payer: BC Managed Care – PPO | Source: Ambulatory Visit | Attending: Radiation Oncology | Admitting: Radiation Oncology

## 2021-06-30 DIAGNOSIS — C50012 Malignant neoplasm of nipple and areola, left female breast: Secondary | ICD-10-CM | POA: Diagnosis not present

## 2021-07-01 ENCOUNTER — Ambulatory Visit
Admission: RE | Admit: 2021-07-01 | Discharge: 2021-07-01 | Disposition: A | Discharge status: Home / Self Care | Payer: BC Managed Care – PPO | Source: Ambulatory Visit | Attending: Radiation Oncology | Admitting: Radiation Oncology

## 2021-07-01 DIAGNOSIS — C50012 Malignant neoplasm of nipple and areola, left female breast: Secondary | ICD-10-CM | POA: Diagnosis not present

## 2021-07-02 ENCOUNTER — Ambulatory Visit
Admission: RE | Admit: 2021-07-02 | Discharge: 2021-07-02 | Disposition: A | Discharge status: Home / Self Care | Payer: BC Managed Care – PPO | Source: Ambulatory Visit | Attending: Radiation Oncology | Admitting: Radiation Oncology

## 2021-07-02 ENCOUNTER — Other Ambulatory Visit: Payer: Self-pay

## 2021-07-02 DIAGNOSIS — C50012 Malignant neoplasm of nipple and areola, left female breast: Secondary | ICD-10-CM | POA: Diagnosis not present

## 2021-07-03 ENCOUNTER — Ambulatory Visit
Admission: RE | Admit: 2021-07-03 | Discharge: 2021-07-03 | Disposition: A | Discharge status: Home / Self Care | Payer: BC Managed Care – PPO | Source: Ambulatory Visit | Attending: Radiation Oncology | Admitting: Radiation Oncology

## 2021-07-03 DIAGNOSIS — C50012 Malignant neoplasm of nipple and areola, left female breast: Secondary | ICD-10-CM | POA: Diagnosis not present

## 2021-07-04 ENCOUNTER — Other Ambulatory Visit: Payer: Self-pay

## 2021-07-04 ENCOUNTER — Ambulatory Visit
Admission: RE | Admit: 2021-07-04 | Discharge: 2021-07-04 | Disposition: A | Discharge status: Home / Self Care | Payer: BC Managed Care – PPO | Source: Ambulatory Visit | Attending: Radiation Oncology | Admitting: Radiation Oncology

## 2021-07-04 DIAGNOSIS — C50012 Malignant neoplasm of nipple and areola, left female breast: Secondary | ICD-10-CM

## 2021-07-04 DIAGNOSIS — Z170 Estrogen receptor positive status [ER+]: Secondary | ICD-10-CM

## 2021-07-04 MED ORDER — RADIAPLEXRX EX GEL: Freq: Once | CUTANEOUS | Status: AC

## 2021-07-07 ENCOUNTER — Ambulatory Visit
Admission: RE | Admit: 2021-07-07 | Discharge: 2021-07-07 | Disposition: A | Discharge status: Home / Self Care | Payer: BC Managed Care – PPO | Source: Ambulatory Visit | Attending: Radiation Oncology | Admitting: Radiation Oncology

## 2021-07-07 DIAGNOSIS — C50012 Malignant neoplasm of nipple and areola, left female breast: Secondary | ICD-10-CM | POA: Diagnosis not present

## 2021-07-08 ENCOUNTER — Ambulatory Visit
Admission: RE | Admit: 2021-07-08 | Discharge: 2021-07-08 | Disposition: A | Discharge status: Home / Self Care | Payer: BC Managed Care – PPO | Source: Ambulatory Visit | Attending: Radiation Oncology | Admitting: Radiation Oncology

## 2021-07-08 ENCOUNTER — Encounter: Payer: Self-pay | Admitting: Radiation Oncology

## 2021-07-08 DIAGNOSIS — C50012 Malignant neoplasm of nipple and areola, left female breast: Secondary | ICD-10-CM | POA: Diagnosis not present

## 2021-07-09 ENCOUNTER — Ambulatory Visit
Admission: RE | Admit: 2021-07-09 | Discharge: 2021-07-09 | Disposition: A | Discharge status: Home / Self Care | Payer: BC Managed Care – PPO | Source: Ambulatory Visit | Attending: Radiation Oncology | Admitting: Radiation Oncology

## 2021-07-09 ENCOUNTER — Other Ambulatory Visit: Payer: Self-pay

## 2021-07-09 DIAGNOSIS — C50012 Malignant neoplasm of nipple and areola, left female breast: Secondary | ICD-10-CM | POA: Diagnosis not present

## 2021-07-10 ENCOUNTER — Ambulatory Visit
Admission: RE | Admit: 2021-07-10 | Discharge: 2021-07-10 | Disposition: A | Discharge status: Home / Self Care | Payer: BC Managed Care – PPO | Source: Ambulatory Visit | Attending: Radiation Oncology | Admitting: Radiation Oncology

## 2021-07-10 DIAGNOSIS — C50012 Malignant neoplasm of nipple and areola, left female breast: Secondary | ICD-10-CM | POA: Diagnosis not present

## 2021-07-11 ENCOUNTER — Ambulatory Visit
Admission: RE | Admit: 2021-07-11 | Discharge: 2021-07-11 | Disposition: A | Discharge status: Home / Self Care | Payer: BC Managed Care – PPO | Source: Ambulatory Visit | Attending: Radiation Oncology | Admitting: Radiation Oncology

## 2021-07-11 ENCOUNTER — Ambulatory Visit: Payer: BC Managed Care – PPO | Admitting: Radiation Oncology

## 2021-07-11 ENCOUNTER — Other Ambulatory Visit: Payer: Self-pay

## 2021-07-11 DIAGNOSIS — C50012 Malignant neoplasm of nipple and areola, left female breast: Secondary | ICD-10-CM | POA: Diagnosis not present

## 2021-07-14 ENCOUNTER — Other Ambulatory Visit: Payer: Self-pay

## 2021-07-14 ENCOUNTER — Ambulatory Visit
Admission: RE | Admit: 2021-07-14 | Discharge: 2021-07-14 | Disposition: A | Discharge status: Home / Self Care | Payer: BC Managed Care – PPO | Source: Ambulatory Visit | Attending: Radiation Oncology | Admitting: Radiation Oncology

## 2021-07-14 DIAGNOSIS — C50012 Malignant neoplasm of nipple and areola, left female breast: Secondary | ICD-10-CM | POA: Diagnosis not present

## 2021-07-15 ENCOUNTER — Ambulatory Visit
Admission: RE | Admit: 2021-07-15 | Discharge: 2021-07-15 | Disposition: A | Discharge status: Home / Self Care | Payer: BC Managed Care – PPO | Source: Ambulatory Visit | Attending: Radiation Oncology | Admitting: Radiation Oncology

## 2021-07-15 DIAGNOSIS — C50012 Malignant neoplasm of nipple and areola, left female breast: Secondary | ICD-10-CM | POA: Diagnosis not present

## 2021-07-16 ENCOUNTER — Other Ambulatory Visit: Payer: BC Managed Care – PPO

## 2021-07-16 ENCOUNTER — Ambulatory Visit: Payer: BC Managed Care – PPO

## 2021-07-16 ENCOUNTER — Encounter: Payer: BC Managed Care – PPO | Admitting: Genetic Counselor

## 2021-07-17 ENCOUNTER — Ambulatory Visit
Admission: RE | Admit: 2021-07-17 | Discharge: 2021-07-17 | Disposition: A | Discharge status: Home / Self Care | Payer: BC Managed Care – PPO | Source: Ambulatory Visit | Attending: Radiation Oncology | Admitting: Radiation Oncology

## 2021-07-17 ENCOUNTER — Other Ambulatory Visit: Payer: Self-pay

## 2021-07-17 DIAGNOSIS — C50012 Malignant neoplasm of nipple and areola, left female breast: Secondary | ICD-10-CM | POA: Diagnosis not present

## 2021-07-18 ENCOUNTER — Other Ambulatory Visit: Payer: Self-pay

## 2021-07-18 ENCOUNTER — Ambulatory Visit
Admission: RE | Admit: 2021-07-18 | Discharge: 2021-07-18 | Disposition: A | Discharge status: Home / Self Care | Payer: BC Managed Care – PPO | Source: Ambulatory Visit | Attending: Radiation Oncology | Admitting: Radiation Oncology

## 2021-07-18 DIAGNOSIS — C50012 Malignant neoplasm of nipple and areola, left female breast: Secondary | ICD-10-CM | POA: Diagnosis not present

## 2021-07-21 ENCOUNTER — Ambulatory Visit: Payer: BC Managed Care – PPO

## 2021-07-21 ENCOUNTER — Encounter: Payer: Self-pay | Admitting: *Deleted

## 2021-07-21 ENCOUNTER — Other Ambulatory Visit: Payer: Self-pay

## 2021-07-21 ENCOUNTER — Ambulatory Visit
Admission: RE | Admit: 2021-07-21 | Discharge: 2021-07-21 | Disposition: A | Discharge status: Home / Self Care | Payer: BC Managed Care – PPO | Source: Ambulatory Visit | Attending: Radiation Oncology | Admitting: Radiation Oncology

## 2021-07-21 DIAGNOSIS — C50012 Malignant neoplasm of nipple and areola, left female breast: Secondary | ICD-10-CM | POA: Diagnosis not present

## 2021-07-22 ENCOUNTER — Encounter: Payer: Self-pay | Admitting: Radiation Oncology

## 2021-07-22 ENCOUNTER — Ambulatory Visit
Admission: RE | Admit: 2021-07-22 | Discharge: 2021-07-22 | Disposition: A | Discharge status: Home / Self Care | Payer: BC Managed Care – PPO | Source: Ambulatory Visit | Attending: Radiation Oncology | Admitting: Radiation Oncology

## 2021-07-22 DIAGNOSIS — C50012 Malignant neoplasm of nipple and areola, left female breast: Secondary | ICD-10-CM | POA: Diagnosis not present

## 2021-07-23 ENCOUNTER — Ambulatory Visit: Payer: BC Managed Care – PPO | Admitting: Hematology

## 2021-07-23 NOTE — Progress Notes (Signed)
°  Radiation Oncology         (336) [phone] ________________________________  Name: Sheila Bryant MRN: [id number]  Date: 07/08/2021  DOB: [birth year]  SIMULATION NOTE   NARRATIVE:  The patient underwent simulation today for ongoing radiation therapy.  The existing CT study set was employed for the purpose of virtual treatment planning.  The target and avoidance structures were reviewed and modified as necessary.  Treatment planning then occurred.  The radiation boost prescription was entered and confirmed.  A total of 3 complex treatment devices were fabricated in the form of multi-leaf collimators to shape radiation around the targets while maximally excluding nearby normal structures. I have requested : Isodose Plan.    PLAN:  This modified radiation beam arrangement is intended to continue the current radiation dose to an additional 8 Gy in 4 fractions for a total cumulative dose of 50.56 Gy.    ------------------------------------------------  Jodelle Gross, MD, PhD

## 2021-07-23 NOTE — Progress Notes (Signed)
° °                                                                                                                                                          °  Patient Name: ARNEISHA KINCANNON MRN: [id number] DOB: 1956-03-19 Referring Physician: Terrill Mohr Date of Service: 07/22/2021 [facility], [place]                                                        End Of Treatment Note  Diagnoses: C50.012-Malignant neoplasm of nipple and areola, left female breast  Cancer Staging: Stage IA, pT1cN0M0, grade 2, ER/PR positive invasive ductal carcinoma of the left breast  Intent: Curative  Radiation Treatment Dates: 06/24/2021 through 07/22/2021 Site Technique Total Dose (Gy) Dose per Fx (Gy) Completed Fx Beam Energies  Breast, Left: Breast_L 3D 42.56/42.56 2.66 16/16 6X, 10X  Breast, Left: Breast_L_Bst 3D 8/8 2 4/4 10X   Narrative: The patient tolerated radiation therapy relatively well. She developed   anticipated skin changes in the treatment field.   Plan: The patient will receive a call in about one month from the radiation oncology department. She will continue follow up with Dr. Burr Medico as well.   ________________________________________________    Carola Rhine, [facility]

## 2021-07-24 ENCOUNTER — Encounter: Payer: Self-pay | Admitting: Hematology

## 2021-07-24 ENCOUNTER — Other Ambulatory Visit: Payer: Self-pay

## 2021-07-24 ENCOUNTER — Inpatient Hospital Stay: Payer: BC Managed Care – PPO | Attending: Hematology | Admitting: Hematology

## 2021-07-24 VITALS — BP 155/75 | HR 64 | Temp 98.1°F | Resp 18 | Ht 64.0 in | Wt 150.0 lb

## 2021-07-24 DIAGNOSIS — C50012 Malignant neoplasm of nipple and areola, left female breast: Secondary | ICD-10-CM | POA: Diagnosis not present

## 2021-07-24 DIAGNOSIS — Z170 Estrogen receptor positive status [ER+]: Secondary | ICD-10-CM | POA: Diagnosis not present

## 2021-07-24 DIAGNOSIS — R232 Flushing: Secondary | ICD-10-CM | POA: Diagnosis not present

## 2021-07-24 DIAGNOSIS — Z803 Family history of malignant neoplasm of breast: Secondary | ICD-10-CM | POA: Diagnosis not present

## 2021-07-24 MED ORDER — GABAPENTIN 100 MG PO CAPS: 100.0000 mg | ORAL_CAPSULE | Freq: Every day | ORAL | 0 refills | Status: DC

## 2021-07-24 MED ORDER — EXEMESTANE 25 MG PO TABS: 25.0000 mg | ORAL_TABLET | Freq: Every day | ORAL | 3 refills | Status: DC

## 2021-07-24 NOTE — Progress Notes (Signed)
[name]   Telephone:([phone]) [phone] Fax:(336) [phone]   Clinic Follow up Note   Patient Care Team: Nickola Major, MD as PCP - General (Family Medicine) Truitt Merle, MD as Consulting Physician (Hematology) Stark Klein, MD as Consulting Physician (General Surgery) Kyung Rudd, MD as Consulting Physician (Radiation Oncology) Mauro Kaufmann, RN as Oncology Nurse Navigator Rockwell Germany, RN as Oncology Nurse Navigator Vania Rea, MD as Consulting Physician (Obstetrics and Gynecology)  Date of Service:  07/24/2021  CHIEF COMPLAINT: f/u of left breast cancer  CURRENT THERAPY:  To start exemestane, 07/2021  ASSESSMENT & PLAN:  Sheila Bryant is a 66 y.o. female with   1. 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 Biopsy 04/15/21 confirmed invasive ductal carcinoma, grade 2-3. -left lumpectomy on 05/08/21 by Dr. Barry Dienes showed 1.3 cm IDC, grade 2, with intermediate grade DCIS. Margins and lymph node negative. -oncotype RS was 7, indicating low risk. No adjuvant chemo needed  -she completed radiation therapy under Dr. Lisbeth Renshaw 1/3-1/31/23. -Given the strong ER and PR expression in her postmenopausal status, we will start her on AI exemestane (she has arthritis in knees).  --The potential benefit and side effects, which includes but not limited to, hot flash, skin and vaginal dryness, metabolic changes ( increased blood glucose, cholesterol, weight, etc.), slightly in increased risk of cardiovascular disease, cataracts, muscular and joint discomfort, osteopenia and osteoporosis, etc, were discussed with her in great details. She is interested, and we'll start in 3-4 weeks  -We also discussed the breast cancer surveillance after her surgery. She will continue annual screening mammogram, self exam, and a routine office visit with lab and exam with [place]. -I encouraged her to  have healthy diet and exercise regularly.   2. Hot flashes -previously managed with HRT -severe, interrupt her sleep. -I discussed the use of nerve medication (such as gabapentin) or antidepressants (such as Effexor) in management. She is interested in gabapentin; I prescribed for her today. She will use at night and titrate dose as needed    3. Bone Health  -she reports her last DEXA was a number of years ago. We will obtain a new one for baseline. She will request repeat with her GYN.   4. Genetics  -Given her family history of breast cancer in her mother and maternal aunt, I recommended genetic testing, will set up for her     PLAN:  -I called in gabapentin to start now and exemestane to start in 3-4 weeks  -survivorship in 3 months -lab and f/u in 6 months   No problem-specific Assessment & Plan notes found for this encounter.   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 Suspicious palpable 0.9 cm mass in the 3 o'clock retroareolar region of the LEFT breast for which biopsy is recommended. 2. LEFT axilla is negative.   04/15/2021 Pathology Results   Diagnosis Breast, left, needle core biopsy, 3 o'clock  INVASIVE DUCTAL CARCINOMA °- SEE COMMENT °Microscopic Comment °Based on the biopsy, the carcinoma appears Nottingham grade 2-3 of 3 and measures 0.7 cm in greatest linear extent. ° °PROGNOSTIC INDICATORS °Results: °The tumor cells are  EQUIVOCAL for Her2 (2+). Her2 by FISH will be performed and results reported separately. 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 BREAST, LEFT, LUMPECTOMY:  °- Invasive ductal carcinoma, 1.3 cm, grade 2  °- Ductal carcinoma in situ, intermediate grade  °- Resection margins are negative for carcinoma - closest is the anterior margin at 0.2 cm  °- Biopsy site changes  °- See oncology table  ° °B. LYMPH NODE, LEFT AXILLARY, SENTINEL, EXCISION:  °- Lymph node, negative for carcinoma (0/1)  °  °05/08/2021 Miscellaneous  ° Oncotype DX recurrence score of 7 predicts a risk of recurrence outside the breast over the next 9 years of 3%. ° °  °06/24/2021 - 07/22/2021 Radiation Therapy  ° Site Technique Total Dose (Gy) Dose per Fx (Gy) Completed Fx Beam Energies  °Breast, Left: Breast_L 3D 42.56/42.56 2.66 16/16 6X, 10X  °Breast, Left: Breast_L_Bst 3D 8/8 2 4/4 10X  ° °  ° ° ° °INTERVAL HISTORY:  °Sheila Bryant is here for a follow up of breast cancer. She was last seen by me on 05/06/21 in consultation. She presents to the clinic alone. °She reports she did very well with surgery and radiation therapy. She notes she experienced some additional "sunburn" to her face and neck from the radiation; this is all healing well. °  °All other systems were reviewed with the patient and are negative. ° °MEDICAL HISTORY:  °Past Medical History:  °Diagnosis Date  ° Cancer ([facility]) 04/15/2021  ° left breast  ° Hypothyroidism   ° PONV (postoperative nausea and vomiting)   ° ° °SURGICAL HISTORY: °Past Surgical History:  °Procedure Laterality Date  ° BREAST  LUMPECTOMY WITH SENTINEL LYMPH NODE BIOPSY Left 05/08/2021  ° Procedure: LEFT BREAST LUMPECTOMY WITH SENTINEL LYMPH NODE BX;  Surgeon: Byerly, Faera, MD;  Location: [facility];  Service: General;  Laterality: Left;  ° CESAREAN SECTION    ° x2 (1987 and 1990)  ° EYE SURGERY Right 2008  ° clogged tear duct  ° WISDOM TOOTH EXTRACTION  1977  ° ° °I have reviewed the social history and family history with the patient and they are unchanged from previous note. ° °ALLERGIES:  is allergic to tape. ° °MEDICATIONS:  °Current Outpatient Medications  °Medication Sig Dispense Refill  ° exemestane (AROMASIN) 25 MG tablet Take 1 tablet (25 mg total) by mouth daily after breakfast. 30 tablet 3  ° gabapentin (NEURONTIN) 100 MG capsule Take 1 capsule (100 mg total) by mouth at bedtime. Increase to 2 caps on second week, and 3 caps on third week if needed and tolerates well 60 capsule 0  ° cetirizine (ZYRTEC) 10 MG tablet Take 10 mg by mouth daily.    ° fluticasone (FLONASE) 50 MCG/ACT nasal spray Place 1 spray into both nostrils daily.    ° meclizine (ANTIVERT) 25 MG tablet Take 25 mg by mouth 3 (three) times daily as needed for dizziness.    ° oxyCODONE (OXY IR/ROXICODONE) 5 MG immediate release   tablet Take 1 tablet (5 mg total) by mouth every 6 (six) hours as needed for severe pain. 10 tablet 0  ° SYNTHROID 75 MCG tablet Take 75 mcg by mouth daily before breakfast.    ° °No current facility-administered medications for this visit.  ° ° °PHYSICAL EXAMINATION: °ECOG PERFORMANCE STATUS: 0 - Asymptomatic ° °Vitals:  ° 07/24/21 1431  °BP: (!) 155/75  °Pulse: 64  °Resp: 18  °Temp: 98.1 °F (36.7 °C)  °SpO2: 100%  ° °Wt Readings from Last 3 Encounters:  °07/24/21 150 lb (68 kg)  °06/10/21 154 lb 9.6 oz (70.1 kg)  °05/08/21 155 lb (70.3 kg)  °  ° °GENERAL:alert, no distress and comfortable °SKIN: skin color, texture, turgor are normal, no rashes or significant lesions °EYES: normal, Conjunctiva are pink and non-injected, sclera clear  °NEURO:  alert & oriented x 3 with fluent speech, no focal motor/sensory deficits °BREAST: No palpable mass, nodules or adenopathy bilaterally. Breast exam benign.  ° °LABORATORY DATA:  °I have reviewed the data as listed °CBC Latest Ref Rng & Units 05/01/2021  °WBC 4.0 - 10.5 K/uL 6.1  °Hemoglobin 12.0 - 15.0 g/dL 14.8  °Hematocrit 36.0 - 46.0 % 44.1  °Platelets 150 - 400 K/uL 265  ° ° ° °No flowsheet data found. ° ° ° °RADIOGRAPHIC STUDIES: °I have personally reviewed the radiological images as listed and agreed with the findings in the report. °No results found.  ° ° °No orders of the defined types were placed in this encounter. ° °All questions were answered. The patient knows to call the clinic with any problems, questions or concerns. No barriers to learning was detected. °The total time spent in the appointment was 30 minutes. ° °  ° Yan Feng, MD °07/24/2021  ° °I, Katie Daubenspeck, am acting as scribe for Yan Feng, MD.  ° °I have reviewed the above documentation for accuracy and completeness, and I agree with the above. °  ° ° °

## 2021-07-28 ENCOUNTER — Other Ambulatory Visit: Payer: Self-pay | Admitting: Genetic Counselor

## 2021-07-28 ENCOUNTER — Encounter: Payer: Self-pay | Admitting: Genetic Counselor

## 2021-07-28 ENCOUNTER — Telehealth: Payer: Self-pay | Admitting: Hematology

## 2021-07-28 DIAGNOSIS — Z170 Estrogen receptor positive status [ER+]: Secondary | ICD-10-CM

## 2021-07-28 DIAGNOSIS — C50012 Malignant neoplasm of nipple and areola, left female breast: Secondary | ICD-10-CM

## 2021-07-28 NOTE — Telephone Encounter (Signed)
Sch per 2/6 inbasket, pt req to r/s , pt aware

## 2021-07-30 ENCOUNTER — Encounter: Payer: BC Managed Care – PPO | Admitting: Genetic Counselor

## 2021-07-30 ENCOUNTER — Other Ambulatory Visit: Payer: BC Managed Care – PPO

## 2021-08-20 ENCOUNTER — Other Ambulatory Visit: Payer: Self-pay | Admitting: Hematology

## 2021-08-25 ENCOUNTER — Ambulatory Visit
Admission: RE | Admit: 2021-08-25 | Discharge: 2021-08-25 | Disposition: A | Discharge status: Home / Self Care | Payer: BC Managed Care – PPO | Source: Ambulatory Visit | Attending: Radiation Oncology | Admitting: Radiation Oncology

## 2021-08-25 ENCOUNTER — Other Ambulatory Visit: Payer: Self-pay

## 2021-08-25 DIAGNOSIS — C50012 Malignant neoplasm of nipple and areola, left female breast: Secondary | ICD-10-CM

## 2021-08-25 DIAGNOSIS — Z170 Estrogen receptor positive status [ER+]: Secondary | ICD-10-CM

## 2021-08-25 NOTE — Progress Notes (Signed)
?  Radiation Oncology         [phone] ?________________________________ ? ?Name: Sheila Bryant MRN: [id number]  ?Date of Service: 08/25/2021  DOB: 08-19-1955 ? ?Post Treatment Telephone Note ? ?Diagnosis:   Stage IA, pT1cN0M0, grade 2, ER/PR positive invasive ductal carcinoma of the left breast ? ?Intent: Curative ? ?Radiation Treatment Dates: 06/24/2021 through 07/22/2021 ?Site Technique Total Dose (Gy) Dose per Fx (Gy) Completed Fx Beam Energies  ?Breast, Left: Breast_L 3D 42.56/42.56 2.66 16/16 6X, 10X  ?Breast, Left: Breast_L_Bst 3D 8/8 2 4/4 10X  ? ? ? ?Impression/Plan: ?1. Stage IA, pT1cN0M0, grade 2, ER/PR positive invasive ductal carcinoma of the left breast. I was unable to reach the patient but left a voicemail and on the message, I discussed that we would be happy to continue to follow her as needed, but she will also continue to follow up with Dr. Burr Medico in medical oncology. She was counseled to call if she had questions about skin care or measures to avoid sun exposure to this area.  ?  ? ? ? ? ?Carola Rhine, PAC  ? ? ? ? ?

## 2021-09-04 ENCOUNTER — Other Ambulatory Visit: Payer: Self-pay

## 2021-09-04 ENCOUNTER — Inpatient Hospital Stay: Payer: BC Managed Care – PPO | Attending: Hematology | Admitting: Genetic Counselor

## 2021-09-04 ENCOUNTER — Other Ambulatory Visit: Payer: Self-pay | Admitting: Genetic Counselor

## 2021-09-04 ENCOUNTER — Inpatient Hospital Stay: Payer: BC Managed Care – PPO

## 2021-09-04 DIAGNOSIS — Z803 Family history of malignant neoplasm of breast: Secondary | ICD-10-CM | POA: Diagnosis not present

## 2021-09-04 DIAGNOSIS — C50012 Malignant neoplasm of nipple and areola, left female breast: Secondary | ICD-10-CM | POA: Diagnosis not present

## 2021-09-04 DIAGNOSIS — Z170 Estrogen receptor positive status [ER+]: Secondary | ICD-10-CM

## 2021-09-04 LAB — GENETIC SCREENING ORDER

## 2021-09-05 ENCOUNTER — Encounter: Payer: Self-pay | Admitting: Genetic Counselor

## 2021-09-05 DIAGNOSIS — Z803 Family history of malignant neoplasm of breast: Secondary | ICD-10-CM

## 2021-09-05 HISTORY — DX: Family history of malignant neoplasm of breast: Z80.3

## 2021-09-05 NOTE — Progress Notes (Signed)
REFERRING PROVIDER: ?Truitt Merle, MD ?[facility] ?[place],  [place] [address] ? ?PRIMARY PROVIDER:  ?Nickola Major, MD ? ?PRIMARY REASON FOR VISIT:  ?1. 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 Family history of breast cancer   ? ? ?HISTORY OF PRESENT ILLNESS:   ?Ms. Bossard, a 66 y.o. female, was seen for a [facility] cancer genetics consultation at the request of Dr. Burr Medico due to a personal and family history of breast cancer.  Ms. Miyoshi presents to clinic today to discuss the possibility of a hereditary predisposition to cancer, to discuss genetic testing, and to further clarify her future cancer risks, as well as potential cancer risks for family members.  ? ?In October 2022, at the age of 34, Ms. Tagliaferri was diagnosed with invasive ductal carcinoma of the left breast. The treatment plan included lumpectomy, adjuvant radiation, and anti-estrogens ? ?CANCER HISTORY:  ?Oncology History Overview Note  ? 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 ?  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 EXAM: ?DIGITAL DIAGNOSTIC BILATERAL MAMMOGRAM WITH TOMOSYNTHESIS AND CAD; ?ULTRASOUND LEFT BREAST LIMITED; ULTRASOUND RIGHT BREAST LIMITED ? ?IMPRESSION: ?1. Suspicious palpable 0.9 cm mass in the 3 o'clock retroareolar region of the LEFT breast for which biopsy is recommended. ?2. LEFT axilla is negative. ?  ?04/15/2021 Pathology Results  ? Diagnosis ?Breast, left, needle core biopsy, 3 o'clock retroareolar ?- INVASIVE DUCTAL CARCINOMA ?- SEE COMMENT ?Microscopic Comment ?Based on the biopsy, the carcinoma appears Nottingham grade 2-3 of 3 and measures 0.7 cm in greatest linear extent. ? ?PROGNOSTIC  INDICATORS ?Results: ?The tumor cells are EQUIVOCAL for Her2 (2+). Her2 by FISH will be performed and results reported separately. ?Estrogen Receptor: 100%, POSITIVE, STRONG STAINING INTENSITY ?Progesterone Receptor: 100%, POSITIVE, STRONG STAINING INTENSITY ?Proliferation Marker Ki67: 15% ? ?FLUORESCENCE IN-SITU HYBRIDIZATION ?Results: ?GROUP 5: HER2 **NEGATIVE** ?  ?05/06/2021 Initial Diagnosis  ? 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  ?05/08/2021 Definitive Surgery  ? FINAL MICROSCOPIC DIAGNOSIS:  ? ?A. BREAST, LEFT, LUMPECTOMY:  ?- Invasive ductal carcinoma, 1.3 cm, grade 2  ?- Ductal carcinoma in situ, intermediate grade  ?- Resection margins are negative for carcinoma - closest is the anterior margin at 0.2 cm  ?- Biopsy site changes  ?- See oncology table  ? ?B. LYMPH NODE, LEFT AXILLARY, SENTINEL, EXCISION:  ?- Lymph node, negative for carcinoma (0/1)  ?  ?05/08/2021 Miscellaneous  ? Oncotype DX recurrence score of 7 predicts a risk of recurrence outside the breast over the next 9 years of 3%. ? ?  ?06/24/2021 - 07/22/2021 Radiation Therapy  ? Site Technique Total Dose (Gy) Dose per Fx (Gy) Completed Fx Beam Energies  ?Breast, Left: Breast_L 3D 42.56/42.56 2.66 16/16 6X, 10X  ?Breast, Left: Breast_L_Bst 3D 8/8 2 4/4 10X  ? ?  ? ? ?RISK FACTORS:  ?Mammogram within the last year: yes ?Colonoscopy: Cologuard in 2022 ?Hysterectomy: no.  ?Ovaries intact: yes.  ?Up to date with pelvic exams: yes. ?Menarche was at age 17.  ?First live birth at age 31.  ?Menopausal status: postmenopausal.  ?OCP use for approximately 10 years.  ?HRT use:  15  years; stopped when  diagnosed with breast cancer ? ?Past Medical History:  ?Diagnosis Date  ? Cancer ([place]) 04/15/2021  ? left breast  ? Family history of breast cancer 09/05/2021  ? Hypothyroidism   ? PONV (postoperative nausea and vomiting)   ? ? ?Past Surgical History:  ?Procedure Laterality Date  ? BREAST LUMPECTOMY WITH  SENTINEL LYMPH NODE BIOPSY Left 05/08/2021  ? Procedure: LEFT BREAST LUMPECTOMY WITH SENTINEL LYMPH NODE BX;  Surgeon: Stark Klein, MD;  Location: [facility];  Service: General;  Laterality: Left;  ? CESAREAN SECTION    ? x2 (1987 and 1990)  ? EYE SURGERY Right 2008  ? clogged tear duct  ? [facility]  ? ? ?FAMILY HISTORY:  ?We obtained a detailed, 4-generation family history.  Significant diagnoses are listed below: ?Family History  ?Problem Relation Age of Onset  ? Breast cancer Mother 39  ? Skin cancer Father   ?     dx after 75; sun-related; face  ? Breast cancer Maternal Aunt 68  ? Bladder Cancer Maternal Uncle   ? Bladder Cancer Paternal Aunt   ?     dx late 90s  ? Lung cancer Paternal Aunt   ?     smoking hx  ? Uterine cancer Maternal Grandmother   ?     or cervical? dx after 50  ? Cancer Cousin   ?     eye; dx 32s; paternal female cousin  ? ? ?Ms. Hagberg is unaware of previous family history of genetic testing for hereditary cancer risks. There is no reported Ashkenazi Jewish ancestry. There is no known consanguinity. ? ?GENETIC COUNSELING ASSESSMENT: Ms. Rideout is a 66 y.o. female with a personal and family history of cancer which is somewhat suggestive of a hereditary cancer syndrome and predisposition to cancer given the presence of related cancers in the family. We, therefore, discussed and recommended the following at today's visit.  ? ?DISCUSSION: We discussed that 5 - 10% of cancer is hereditary.  Most cases of hereditary breast cancer are associated with mutations in BRCA1/2.  There are other genes that can be associated with hereditary breast cancer syndromes.  We discussed that testing is beneficial for several reasons including knowing how to follow individuals for their cancer risks, identifying whether potential treatment options would be beneficial, and understanding if other family members could be at risk for cancer and allowing them to undergo genetic testing.  ? ?We reviewed the  characteristics, features and inheritance patterns of hereditary cancer syndromes. We also discussed genetic testing, including the appropriate family members to test, the process of testing, insurance coverage and turn-around-time for results. We discussed the implications of a negative, positive, carrier and/or variant of uncertain significant result. We recommended Ms. Truman Hayward pursue genetic testing for a panel that includes genes associated with breast and gynecological cancers.  ? ?Ms. Durfee  was offered a common hereditary cancer panel (47 genes) and an expanded pan-cancer panel (77 genes). Ms. Harman was informed of the benefits and limitations of each panel, including that expanded pan-cancer panels contain genes that do not have clear management guidelines at this point in time.  We also discussed that as the number of genes included on a panel increases, the chances of variants of uncertain significance increases.  After considering the benefits and limitations of each gene panel, Ms. Palen  elected to have a common hereditary cancers panel through [facility]. ? ?The CustomNext-Cancer+RNAinsight panel offered by [facility] includes sequencing and rearrangement analysis for  the following 47 genes:  APC, ATM, AXIN2, BARD1, BMPR1A, BRCA1, BRCA2, BRIP1, CDH1, CDK4, CDKN2A, CHEK2, DICER1, EPCAM, GREM1, HOXB13, MEN1, MLH1, MSH2, MSH3, MSH6, MUTYH, NBN, NF1, NF2, NTHL1, PALB2, PMS2, POLD1, POLE, PTEN, RAD51C, RAD51D, RECQL, RET, SDHA, SDHAF2, SDHB, SDHC, SDHD, SMAD4, SMARCA4, STK11, TP53, TSC1, TSC2, and VHL.  RNA data is routinely analyzed for use in variant interpretation for all genes. ? ?Based on Ms. Stagliano personal and family history of cancer, she meets medical criteria for genetic testing. Despite that she meets criteria, she may still have an out of pocket cost. We discussed that if her out of pocket cost for testing is over $100, the laboratory should contact her and discuss the self-pay prices and/or patient pay  assistance programs.   ? ?PLAN: After considering the risks, benefits, and limitations, Ms. Jutras provided informed consent to pursue genetic testing and the blood sample was sent to [facility] for analysis of the [facility]

## 2021-09-08 ENCOUNTER — Other Ambulatory Visit: Payer: Self-pay | Admitting: Hematology

## 2021-09-15 ENCOUNTER — Encounter: Payer: Self-pay | Admitting: Genetic Counselor

## 2021-09-15 ENCOUNTER — Telehealth: Payer: Self-pay | Admitting: Genetic Counselor

## 2021-09-15 DIAGNOSIS — Z1379 Encounter for other screening for genetic and chromosomal anomalies: Secondary | ICD-10-CM

## 2021-09-15 HISTORY — DX: Encounter for other screening for genetic and chromosomal anomalies: Z13.79

## 2021-09-15 NOTE — Telephone Encounter (Signed)
Contacted patient in attempt to disclose results of genetic testing.  LVM with contact information requesting a call back.  

## 2021-09-25 ENCOUNTER — Ambulatory Visit: Payer: Self-pay | Admitting: Genetic Counselor

## 2021-09-25 DIAGNOSIS — Z1379 Encounter for other screening for genetic and chromosomal anomalies: Secondary | ICD-10-CM

## 2021-09-25 DIAGNOSIS — C50012 Malignant neoplasm of nipple and areola, left female breast: Secondary | ICD-10-CM

## 2021-09-25 DIAGNOSIS — Z803 Family history of malignant neoplasm of breast: Secondary | ICD-10-CM

## 2021-09-25 NOTE — Progress Notes (Signed)
HPI:   ?Sheila Bryant was previously seen in the [facility] clinic due to a personal and family history of cancer and concerns regarding a hereditary predisposition to cancer. Please refer to our prior cancer genetics clinic note for more information regarding our discussion, assessment and recommendations, at the time. Sheila Bryant recent genetic test results were disclosed to her, as were recommendations warranted by these results. These results and recommendations are discussed in more detail below. ? ?CANCER HISTORY:  ?Oncology History Overview Note  ? 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 ?  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 EXAM: ?DIGITAL DIAGNOSTIC BILATERAL MAMMOGRAM WITH TOMOSYNTHESIS AND CAD; ?ULTRASOUND LEFT BREAST LIMITED; ULTRASOUND RIGHT BREAST LIMITED ? ?IMPRESSION: ?1. Suspicious palpable 0.9 cm mass in the 3 o'clock retroareolar region of the LEFT breast for which biopsy is recommended. ?2. LEFT axilla is negative. ?  ?04/15/2021 Pathology Results  ? Diagnosis ?Breast, left, needle core biopsy, 3 o'clock retroareolar ?- INVASIVE DUCTAL CARCINOMA ?- SEE COMMENT ?Microscopic Comment ?Based on the biopsy, the carcinoma appears Nottingham grade 2-3 of 3 and measures 0.7 cm in greatest linear extent. ? ?PROGNOSTIC INDICATORS ?Results: ?The tumor cells are EQUIVOCAL for Her2 (2+). Her2 by FISH will be performed and results reported separately. ?Estrogen Receptor: 100%, POSITIVE, STRONG STAINING INTENSITY ?Progesterone Receptor: 100%, POSITIVE, STRONG STAINING INTENSITY ?Proliferation Marker Ki67: 15% ? ?FLUORESCENCE IN-SITU HYBRIDIZATION ?Results: ?GROUP 5: HER2 **NEGATIVE** ?  ?05/06/2021 Initial Diagnosis  ? Malignant  neoplasm of areola of left breast in female, estrogen receptor positive ([place]) ?  ?05/08/2021 Definitive Surgery  ? FINAL MICROSCOPIC DIAGNOSIS:  ? ?A. BREAST, LEFT, LUMPECTOMY:  ?- Invasive ductal carcinoma, 1.3 cm, grade 2  ?- Ductal carcinoma in situ, intermediate grade  ?- Resection margins are negative for carcinoma - closest is the anterior margin at 0.2 cm  ?- Biopsy site changes  ?- See oncology table  ? ?B. LYMPH NODE, LEFT AXILLARY, SENTINEL, EXCISION:  ?- Lymph node, negative for carcinoma (0/1)  ?  ?05/08/2021 Miscellaneous  ? Oncotype DX recurrence score of 7 predicts a risk of recurrence outside the breast over the next 9 years of 3%. ? ?  ?06/24/2021 - 07/22/2021 Radiation Therapy  ? Site Technique Total Dose (Gy) Dose per Fx (Gy) Completed Fx Beam Energies  ?Breast, Left: Breast_L 3D 42.56/42.56 2.66 16/16 6X, 10X  ?Breast, Left: Breast_L_Bst 3D 8/8 2 4/4 10X  ? ?  ?09/15/2021 Genetic Testing  ? Negative hereditary cancer genetic testing: no pathogenic variants detected in [facility] CustomNext-Cancer +RNAinsight Panel.  Report date is September 14, 2021.  ? ?The CustomNext-Cancer+RNAinsight panel offered by [facility] includes sequencing and rearrangement analysis for the following 47 genes:  APC, ATM, AXIN2, BARD1, BMPR1A, BRCA1, BRCA2, BRIP1, CDH1, CDK4, CDKN2A, CHEK2, DICER1, EPCAM, GREM1, HOXB13, MEN1, MLH1, MSH2, MSH3, MSH6, MUTYH, NBN, NF1, NF2, NTHL1, PALB2, PMS2, POLD1, POLE, PTEN, RAD51C, RAD51D, RECQL, RET, SDHA, SDHAF2, SDHB, SDHC, SDHD, SMAD4, SMARCA4, STK11, TP53, TSC1, TSC2, and VHL.  RNA data is routinely analyzed for use in variant interpretation for all genes. ?  ? ? ?FAMILY HISTORY:  ?We obtained a detailed, 4-generation family history.  Significant diagnoses are listed below: ?     ?Family History  ?Problem Relation Age of  Onset  ? Breast cancer Mother 58  ? Skin cancer Father    ?      dx after 77; sun-related; face  ? Breast cancer Maternal Aunt 31  ? Bladder Cancer Maternal Uncle    ?  Bladder Cancer Paternal Aunt    ?      dx late 32s  ? Lung cancer Paternal Aunt    ?      smoking hx  ? Uterine cancer Maternal Grandmother    ?      or cervical? dx after 50  ? Cancer Cousin    ?      eye; dx 65s; paternal female cousin  ?  ? ?Sheila Bryant is unaware of previous family history of genetic testing for hereditary cancer risks. There is no reported Ashkenazi Jewish ancestry. There is no known consanguinity. ? ?GENETIC TEST RESULTS:  ?The [facility] CustomNext-Cancer +RNAinsight Panel found no pathogenic mutations. The CustomNext-Cancer+RNAinsight panel offered by [facility] includes sequencing and rearrangement analysis for the following 47 genes:  APC, ATM, AXIN2, BARD1, BMPR1A, BRCA1, BRCA2, BRIP1, CDH1, CDK4, CDKN2A, CHEK2, DICER1, EPCAM, GREM1, HOXB13, MEN1, MLH1, MSH2, MSH3, MSH6, MUTYH, NBN, NF1, NF2, NTHL1, PALB2, PMS2, POLD1, POLE, PTEN, RAD51C, RAD51D, RECQL, RET, SDHA, SDHAF2, SDHB, SDHC, SDHD, SMAD4, SMARCA4, STK11, TP53, TSC1, TSC2, and VHL.  RNA data is routinely analyzed for use in variant interpretation for all genes. ? ?The test report has been scanned into EPIC and is located under the Molecular Pathology section of the Results Review tab.  A portion of the result report is included below for reference. Genetic testing reported out on September 14, 2021.  ? ? ? ? ?Even though a pathogenic variant was not identified, possible explanations for the cancer in the family may include: ?There may be no hereditary risk for cancer in the family. The cancers in Sheila Bryant and/or her family may be sporadic/familial or due to other genetic and environmental factors. ?There may be a gene mutation in one of these genes that current testing methods cannot detect but that chance is small. ?There could be another gene that has not yet been discovered, or that we have not yet tested, that is responsible for the cancer diagnoses in the family.  ?It is also possible there is a hereditary cause for the cancer in the  family that Sheila Bryant did not inherit. ? ?Therefore, it is important to remain in touch with cancer genetics in the future so that we can continue to offer Sheila Bryant the most up to date genetic testing.  ? ?ADDITIONAL GENETIC TESTING:  ?We discussed with Sheila Bryant that her genetic testing was fairly extensive.  If there other relevant are genes identified to increase cancer risk that can be analyzed in the future, we would be happy to discuss and coordinate this testing at that time.   ? ?CANCER SCREENING RECOMMENDATIONS:  ?Sheila Bryant's test result is considered negative (normal).  This means that we have not identified a hereditary cause for her personal history of breast cancer at this time.  ? ?An individual's cancer risk and medical management are not determined by genetic test results alone. Overall cancer risk assessment incorporates additional factors, including personal medical history, family history, and any available genetic information that may result in a personalized plan for cancer prevention and surveillance. Therefore, it is recommended she continue to follow the cancer management and screening guidelines provided by her oncology and primary healthcare provider. ? ?RECOMMENDATIONS FOR FAMILY MEMBERS:   ?  Since she did not inherit a identifiable mutation in a cancer predisposition gene included on this panel, her children could not have inherited a known mutation from her in one of these genes. ?Individuals in this family might be at some increased risk of developing cancer, over the general population risk, due to the family history of cancer.  Individuals in the family should notify their providers of the family history of cancer. We recommend women in this family have a yearly mammogram beginning at age 5, or 44 years younger than the earliest onset of cancer, an annual clinical breast exam, and perform monthly breast self-exams.  ? ? ?FOLLOW-UP:  ?Lastly, we discussed with Sheila Bryant that cancer genetics is  a rapidly advancing field and it is possible that new genetic tests will be appropriate for her and/or her family members in the future. We encouraged her to remain in contact with cancer genetics on an

## 2021-09-25 NOTE — Telephone Encounter (Signed)
Revealed negative genetic testing.  Discussed that we do not know why she had breast cancer or why there is cancer in the family. It could be sporadic/famillial, due to a change in a gene that she did not inherit, due to a different gene that we are not testing, or maybe our current technology may not be able to pick something up.  It will be important for her to keep in contact with genetics to keep up with whether additional testing may be needed.   ? ? ?

## 2021-10-06 ENCOUNTER — Other Ambulatory Visit: Payer: Self-pay | Admitting: Hematology

## 2021-10-31 ENCOUNTER — Telehealth: Payer: Self-pay | Admitting: *Deleted

## 2021-11-04 ENCOUNTER — Inpatient Hospital Stay: Payer: BC Managed Care – PPO | Attending: Hematology | Admitting: Nurse Practitioner

## 2021-11-04 ENCOUNTER — Encounter: Payer: Self-pay | Admitting: Nurse Practitioner

## 2021-11-04 ENCOUNTER — Other Ambulatory Visit: Payer: Self-pay

## 2021-11-04 VITALS — BP 154/90 | HR 57 | Temp 97.5°F | Resp 17 | Wt 149.7 lb

## 2021-11-04 DIAGNOSIS — Z8049 Family history of malignant neoplasm of other genital organs: Secondary | ICD-10-CM | POA: Insufficient documentation

## 2021-11-04 DIAGNOSIS — Z8052 Family history of malignant neoplasm of bladder: Secondary | ICD-10-CM | POA: Insufficient documentation

## 2021-11-04 DIAGNOSIS — Z170 Estrogen receptor positive status [ER+]: Secondary | ICD-10-CM | POA: Insufficient documentation

## 2021-11-04 DIAGNOSIS — Z801 Family history of malignant neoplasm of trachea, bronchus and lung: Secondary | ICD-10-CM | POA: Insufficient documentation

## 2021-11-04 DIAGNOSIS — Z803 Family history of malignant neoplasm of breast: Secondary | ICD-10-CM | POA: Insufficient documentation

## 2021-11-04 DIAGNOSIS — C50012 Malignant neoplasm of nipple and areola, left female breast: Secondary | ICD-10-CM | POA: Diagnosis not present

## 2021-11-04 DIAGNOSIS — Z808 Family history of malignant neoplasm of other organs or systems: Secondary | ICD-10-CM | POA: Insufficient documentation

## 2021-11-04 NOTE — Progress Notes (Signed)
? ?CLINIC:  ?Survivorship  ? ?Patient Care Team: ?Nickola Major, MD as PCP - General (Family Medicine) ?Truitt Merle, MD as Consulting Physician (Hematology) ?Stark Klein, MD as Consulting Physician (General Surgery) ?Kyung Rudd, MD as Consulting Physician (Radiation Oncology) ?Mauro Kaufmann, RN as Oncology Nurse Navigator ?Rockwell Germany, RN as Oncology Nurse Navigator ?Vania Rea, MD as Consulting Physician (Obstetrics and Gynecology) ?Alla Feeling, NP as Nurse Practitioner (Nurse Practitioner)  ? ?REASON FOR VISIT:  ?Routine follow-up post-treatment for a recent history of breast cancer. ? ?BRIEF ONCOLOGIC HISTORY:  ?Oncology History Overview Note  ? 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 ?  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 EXAM: ?DIGITAL DIAGNOSTIC BILATERAL MAMMOGRAM WITH TOMOSYNTHESIS AND CAD; ?ULTRASOUND LEFT BREAST LIMITED; ULTRASOUND RIGHT BREAST LIMITED ? ?IMPRESSION: ?1. Suspicious palpable 0.9 cm mass in the 3 o'clock retroareolar region of the LEFT breast for which biopsy is recommended. ?2. LEFT axilla is negative. ?  ?04/15/2021 Pathology Results  ? Diagnosis ?Breast, left, needle core biopsy, 3 o'clock retroareolar ?- INVASIVE DUCTAL CARCINOMA ?- SEE COMMENT ?Microscopic Comment ?Based on the biopsy, the carcinoma appears Nottingham grade 2-3 of 3 and measures 0.7 cm in greatest linear extent. ? ?PROGNOSTIC INDICATORS ?Results: ?The tumor cells are EQUIVOCAL for Her2 (2+). Her2 by FISH will be performed and results reported separately. ?Estrogen Receptor: 100%, POSITIVE, STRONG STAINING INTENSITY ?Progesterone Receptor: 100%, POSITIVE, STRONG STAINING INTENSITY ?Proliferation Marker Ki67: 15% ? ?FLUORESCENCE  IN-SITU HYBRIDIZATION ?Results: ?GROUP 5: HER2 **NEGATIVE** ?  ?05/06/2021 Initial Diagnosis  ? 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  ?05/08/2021 Definitive Surgery  ? FINAL MICROSCOPIC DIAGNOSIS:  ? ?A. BREAST, LEFT, LUMPECTOMY:  ?- Invasive ductal carcinoma, 1.3 cm, grade 2  ?- Ductal carcinoma in situ, intermediate grade  ?- Resection margins are negative for carcinoma - closest is the anterior margin at 0.2 cm  ?- Biopsy site changes  ?- See oncology table  ? ?B. LYMPH NODE, LEFT AXILLARY, SENTINEL, EXCISION:  ?- Lymph node, negative for carcinoma (0/1)  ?  ?05/08/2021 Miscellaneous  ? Oncotype DX recurrence score of 7 predicts a risk of recurrence outside the breast over the next 9 years of 3%. ? ?  ?06/24/2021 - 07/22/2021 Radiation Therapy  ? Site Technique Total Dose (Gy) Dose per Fx (Gy) Completed Fx Beam Energies  ?Breast, Left: Breast_L 3D 42.56/42.56 2.66 16/16 6X, 10X  ?Breast, Left: Breast_L_Bst 3D 8/8 2 4/4 10X  ? ?  ?07/2021 -  Anti-estrogen oral therapy  ? Began adjuvant exemestane  ?  ?09/15/2021 Genetic Testing  ? Negative hereditary cancer genetic testing: no pathogenic variants detected in [facility] CustomNext-Cancer +RNAinsight Panel.  Report date is September 14, 2021.  ? ?The CustomNext-Cancer+RNAinsight panel offered by [facility] includes sequencing and rearrangement analysis for the following 47 genes:  APC, ATM, AXIN2, BARD1, BMPR1A, BRCA1, BRCA2, BRIP1, CDH1, CDK4, CDKN2A, CHEK2, DICER1, EPCAM, GREM1, HOXB13, MEN1, MLH1, MSH2, MSH3, MSH6, MUTYH, NBN, NF1, NF2, NTHL1, PALB2, PMS2, POLD1, POLE, PTEN, RAD51C, RAD51D, RECQL, RET, SDHA, SDHAF2, SDHB, SDHC, SDHD, SMAD4, SMARCA4, STK11, TP53, TSC1, TSC2, and VHL.  RNA data is routinely analyzed for use in variant interpretation for all genes. ?  ?11/04/2021 Cancer Staging  ?  Staging form: Breast, AJCC 8th Edition ?- Pathologic: Stage IA (pT1c, pN0, cM0, G2, ER+, PR+, HER2-, Oncotype DX score: 7) -  Signed by Alla Feeling, NP on 11/04/2021 ?Multigene prognostic tests performed: Oncotype DX ?Recurrence score range: Less than 11 ?Histologic grading system: 3 grade system ? ?  ?11/04/2021 Survivorship  ? SCP delivered by Cira Rue, NP ?  ? ? ?INTERVAL HISTORY:  ?Sheila Bryant presents to the [facility] today for our initial meeting to review her survivorship care plan detailing her treatment course for breast cancer, as well as monitoring long-term side effects of that treatment, education regarding health maintenance, screening, and overall wellness and health promotion.    ? ?Overall, Sheila Bryant reports feeling well since completing her radiation therapy.  Her skin has recovered with occasional soreness at the left breast/axilla.  Compression helps.  Her breast still appears bruised.  She has "bad hot flashes" but managed with 2 tabs gabapentin at night which helps her sleep.  She uses a fan throughout the day.  This is overall tolerable.  She feels tired, thyroid has fluctuated but med is being adjusted.  Denies bone or joint pain, mood fluctuation, or any other new specific complaints.  [place] ? ? ? ?REVIEW OF SYSTEMS:  ?Review of Systems - Oncology ?Breast: Denies any new nodularity, masses, tenderness, nipple changes, or nipple discharge.  ? ? ? ? ?ONCOLOGY TREATMENT TEAM:  ?1. Surgeon:  Dr. Barry Dienes at [facility] ?2. Medical Oncologist: Dr. Burr Medico ?3. Radiation Oncologist: Dr. Lisbeth Renshaw ?  ? ?PAST MEDICAL/SURGICAL HISTORY:  ?Past Medical History:  ?Diagnosis Date  ? Cancer ([place]) 04/15/2021  ? left breast  ? Family history of breast cancer 09/05/2021  ? Genetic testing 09/15/2021  ? Negative hereditary cancer genetic testing: no pathogenic variants detected in [facility] CustomNext-Cancer +RNAinsight Panel.  Report date is September 14, 2021.   The CustomNext-Cancer+RNAinsight panel offered by [facility] includes sequencing and rearrangement analysis for the  following 47 genes:  APC, ATM, AXIN2, BARD1, BMPR1A, BRCA1, BRCA2, BRIP1, CDH1, CDK4, CDKN2A, CHEK2, DICER1, EPCAM, GREM1, H  ? Hypothyroidism   ? PONV (postoperative nausea and vomiting)   ? ?Past Surgical History:  ?Procedure Laterality Date  ? BREAST LUMPECTOMY WITH SENTINEL LYMPH NODE BIOPSY Left 05/08/2021  ? Procedure: LEFT BREAST LUMPECTOMY WITH SENTINEL LYMPH NODE BX;  Surgeon: Stark Klein, MD;  Location: [place];  Service: General;  Laterality: Left;  ? CESAREAN SECTION    ? x2 (1987 and 1990)  ? EYE SURGERY Right 2008  ? clogged tear duct  ? [place]  ? ? ? ?ALLERGIES:  ?Allergies  ?Allergen Reactions  ? Tape Rash  ?  Pt states all tapes cause a rash besides paper tape or cloth tape  ? ? ? ?CURRENT MEDICATIONS:  ?Outpatient Encounter Medications as of 11/04/2021  ?Medication Sig  ? cetirizine (ZYRTEC) 10 MG tablet Take 10 mg by mouth daily.  ? exemestane (AROMASIN) 25 MG tablet Take 1 tablet (25 mg total) by mouth daily after breakfast.  ? fluticasone (FLONASE) 50 MCG/ACT nasal spray Place 1 spray into both nostrils daily.  ? gabapentin (NEURONTIN) 100 MG capsule Take 2 capsules (200 mg total) by mouth at bedtime.  ? meclizine (ANTIVERT) 25 MG tablet Take 25 mg by mouth 3 (three) times daily as needed for dizziness.  ? SYNTHROID 75 MCG tablet Take 75 mcg by mouth daily before breakfast.  ? ?  No facility-administered encounter medications on file as of 11/04/2021.  ? ? ? ?ONCOLOGIC FAMILY HISTORY:  ?Family History  ?Problem Relation Age of Onset  ? Breast cancer Mother 30  ? COPD Mother   ? Heart disease Mother   ? Diabetes Father   ? Heart disease Father   ? Skin cancer Father   ?     dx after 36; sun-related; face  ? Breast cancer Maternal Aunt 42  ? Bladder Cancer Maternal Uncle   ? Bladder Cancer Paternal Aunt   ?     dx late 92s  ? Lung cancer Paternal Aunt   ?     smoking hx  ? Uterine cancer Maternal Grandmother   ?     or cervical? dx after 50  ? ADD / ADHD Daughter   ? ADD / ADHD  Son   ? Cancer Cousin   ?     eye; dx 24s; paternal female cousin  ? ? ? ?GENETIC COUNSELING/TESTING: ?Yes, negative ? ?SOCIAL HISTORY:  ?Sheila Bryant is married and lives with her spouse in [place], [place]

## 2021-11-13 ENCOUNTER — Ambulatory Visit
Admission: RE | Admit: 2021-11-13 | Discharge: 2021-11-13 | Disposition: A | Discharge status: Home / Self Care | Payer: BC Managed Care – PPO | Source: Ambulatory Visit | Attending: Nurse Practitioner | Admitting: Nurse Practitioner

## 2021-11-13 DIAGNOSIS — C50012 Malignant neoplasm of nipple and areola, left female breast: Secondary | ICD-10-CM

## 2021-11-14 ENCOUNTER — Telehealth: Payer: Self-pay

## 2021-11-14 NOTE — Telephone Encounter (Signed)
Pt called requesting to speak with Dr. Burr Medico or Cira Rue, NP regarding her recent DEXA results.  Pt has gotten her results and is not happy with those results.  Pt stated she is stopping the Exemestane as a result of the DEXA.  Pt stated she would rather take the risk of the cancer to come back than to proceed with taking the medication if it's going to do this much damage to her bones.  Pt stated the report recommended that she start taking supplemental calcium and and vitamin D.  Pt stated she stopped taking the medicine today and will remain off of it until she speaks with Dr. Burr Medico or Cira Rue, NP.  Informed pt that Dr. Burr Medico will be out of the office for 2 wks but this RN will ask Cira Rue, NP to give her a call as soon as possible.  Pt verbalized understanding and had no further questions or concerns at this time.

## 2021-11-16 ENCOUNTER — Other Ambulatory Visit: Payer: Self-pay | Admitting: Hematology

## 2021-11-18 ENCOUNTER — Telehealth: Payer: Self-pay | Admitting: Nurse Practitioner

## 2021-11-18 NOTE — Telephone Encounter (Signed)
I called Ms. Rajan back to review DEXA, which shows mild to moderate osteopenia, lowest T score -2.1. Last DEXA years ago she believes was normal. She stopped taking Exemestane 11/13/21 and back already feels better, also started daily calcium/vit D combo.   She is reluctant to resume anti-estrogen therapy. I discussed the option to switch to Tamoxifen which has bone strengthening qualities. I also reviewed the potential risks of endometrial cancer and thrombosis on tamoxifen. She has ob/gyn she would see for annual pelvic exams. She has no h/o DVT/thrombosis and would be a good candidate for tamoxifen.   She will think about her options and let us know. She will keep her next f/up with Dr. Burr Medico on 02/25/22.   She denies other needs and appreciates the call.   Cira Rue, NP

## 2021-12-08 ENCOUNTER — Other Ambulatory Visit: Payer: Self-pay | Admitting: Hematology

## 2022-01-04 NOTE — Therapy (Signed)
OUTPATIENT PHYSICAL THERAPY ONCOLOGY EVALUATION  Patient Name: Sheila Bryant MRN: [id number] DOB:1956/05/28, 66 y.o., female Today's Date: 01/05/2022   PT End of Session - 01/05/22 1044     Visit Number 1    Number of Visits 5    PT Start Time 1000    PT Stop Time 1049    PT Time Calculation (min) 49 min    Activity Tolerance Patient tolerated treatment well    Behavior During Therapy [facility] for tasks assessed/performed             Past Medical History:  Diagnosis Date   Cancer ([place]) 04/15/2021   left breast   Family history of breast cancer 09/05/2021   Genetic testing 09/15/2021   Negative hereditary cancer genetic testing: no pathogenic variants detected in [facility] CustomNext-Cancer +RNAinsight Panel.  Report date is September 14, 2021.   The CustomNext-Cancer+RNAinsight panel offered by [facility] includes sequencing and rearrangement analysis for the following 47 genes:  APC, ATM, AXIN2, BARD1, BMPR1A, BRCA1, BRCA2, BRIP1, CDH1, CDK4, CDKN2A, CHEK2, DICER1, EPCAM, GREM1, H   Hypothyroidism    PONV (postoperative nausea and vomiting)    Past Surgical History:  Procedure Laterality Date   BREAST LUMPECTOMY WITH SENTINEL LYMPH NODE BIOPSY Left 05/08/2021   Procedure: LEFT BREAST LUMPECTOMY WITH SENTINEL LYMPH NODE BX;  Surgeon: Stark Klein, MD;  Location: [place];  Service: General;  Laterality: Left;   CESAREAN SECTION     x2 (1987 and 1990)   EYE SURGERY Right 2008   clogged tear duct   [place]   Patient Active Problem List   Diagnosis Date Noted   Genetic testing 09/15/2021   Family history of breast cancer 09/05/2021   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 Terrill Mohr  REFERRING PROVIDER: Dr. 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  Maybe the arm is stiff.    PERTINENT HISTORY:  Pt  underwent left seed localized lumpectomy 05/08/21. Margins were negative and one lymph node was negative. Oncotype score was 7. Genetics were negative. She received adjuvant radiation followed by exemestane. PAIN:  Are you having pain? Yes only intermittent breast pain NPRS scale: 4/10 Pain location: in the breast or axilla Pain orientation: Left  PAIN TYPE: aching Pain description: intermittent  Aggravating factors: nothing Relieving factors: rub on it   PRECAUTIONS: Left lymphedema risk  WEIGHT BEARING RESTRICTIONS No  FALLS:  Has patient fallen in last 6 months? 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   OBSERVATIONS / OTHER ASSESSMENTS: enlarged pores around the nipple, blue coloring around nipple from dye.    POSTURE: WNL  UPPER EXTREMITY AROM/PROM:  A/PROM RIGHT   eval   Shoulder extension   Shoulder flexion 159  Shoulder abduction   Shoulder internal rotation   Shoulder external rotation     (Blank rows = not tested)  A/PROM LEFT    eval  Shoulder extension   Shoulder flexion 159  Shoulder abduction WNL  Shoulder internal rotation   Shoulder external rotation     (Blank rows = not tested)  TODAY'S TREATMENT  With pt permission for breast MLD Education on lymphatic anatomy and drainage patterns of the breast and principles of MLD Performed each step in supine per instruction section below with PT reading and then performing each step and then with hand over hand cueing and performance by pt with modification as needed - noted below.  Made pt foam chip pack for use in fibrotic breast locations with instruction on using 45mn-1 hour to start and then as needed Gave pt new prescription for compression bra and encouraged use as much as possible as the most important component of decreasing breast edema.    PATIENT EDUCATION:  Education details: per daily note above Person educated: Patient Education method: Explanation, Demonstration, Tactile cues, Verbal cues, and Handouts Education comprehension: verbalized understanding, returned demonstration, verbal cues required, tactile cues required, and needs further education  HOME EXERCISE PROGRAM: Self MLD, use of foam and bra  ASSESSMENT:  CLINICAL IMPRESSION: Patient is a 66y.o. female who was seen today for physical therapy evaluation and treatment for of ongoing left breast edema and fibrosis post radiation and lumpectomy.  Pt has overall mild edema and fibrosis but continues to notice it especially removing her bra at the end of the day or in the morning.  Pt has good scar mobility but does have some pull into the lateral breast with overhead shoulder flexion which may demonstrate some decreased fascial mobility.   OBJECTIVE IMPAIRMENTS increased edema and increased fascial restrictions.   ACTIVITY LIMITATIONS none  PARTICIPATION LIMITATIONS: none  PERSONAL FACTORS radiation hx  are also affecting patient's functional outcome.   REHAB POTENTIAL:  Excellent  CLINICAL DECISION MAKING: Stable/uncomplicated  EVALUATION COMPLEXITY: Low  GOALS: Goals reviewed with patient? Yes  LONG TERM GOALS: Target date: 02/02/22   Pt will be ind with self MLD for the left breast Baseline:  Goal status: INITIAL  2.  Pt will be ind with compression bra with foam Baseline:  Goal status: INITIAL  3.  Pt will have decrease in breast disability index by at least 10 points Baseline: 25/80 Goal status: INITIAL   PLAN: PT FREQUENCY: 1x/week  PT DURATION: 4 weeks  PLANNED INTERVENTIONS: Therapeutic exercises, Patient/Family education, Self Care, Joint mobilization, DME instructions, Manual lymph drainage, scar mobilization, Taping, Manual therapy, and Re-evaluation  PLAN FOR NEXT SESSION: how was MLD and foam? Get bra? Continue left breast MLD and include some Left MFR as needed   Stark Bray, PT 01/05/2022, 8:38 PM

## 2022-01-05 ENCOUNTER — Encounter: Payer: Self-pay | Admitting: Rehabilitation

## 2022-01-05 ENCOUNTER — Other Ambulatory Visit: Payer: Self-pay

## 2022-01-05 ENCOUNTER — Ambulatory Visit: Payer: BC Managed Care – PPO | Attending: General Surgery | Admitting: Rehabilitation

## 2022-01-05 DIAGNOSIS — Z170 Estrogen receptor positive status [ER+]: Secondary | ICD-10-CM | POA: Diagnosis not present

## 2022-01-05 DIAGNOSIS — I890 Lymphedema, not elsewhere classified: Secondary | ICD-10-CM | POA: Diagnosis not present

## 2022-01-05 DIAGNOSIS — C50012 Malignant neoplasm of nipple and areola, left female breast: Secondary | ICD-10-CM | POA: Diagnosis present

## 2022-01-14 ENCOUNTER — Ambulatory Visit: Payer: BC Managed Care – PPO | Admitting: Rehabilitation

## 2022-01-14 DIAGNOSIS — I890 Lymphedema, not elsewhere classified: Secondary | ICD-10-CM

## 2022-01-14 DIAGNOSIS — C50012 Malignant neoplasm of nipple and areola, left female breast: Secondary | ICD-10-CM | POA: Diagnosis not present

## 2022-01-14 NOTE — Therapy (Signed)
OUTPATIENT PHYSICAL THERAPY ONCOLOGY TREATMENT  Patient Name: Sheila Bryant MRN: [id number] DOB:03-Mar-1956, 66 y.o., female Today's Date: 01/14/2022   PT End of Session - 01/14/22 1656     Visit Number 2    Number of Visits 5    PT Start Time 1600    PT Stop Time 6834    PT Time Calculation (min) 39 min    Activity Tolerance Patient tolerated treatment well    Behavior During Therapy [facility] for tasks assessed/performed              Past Medical History:  Diagnosis Date   Cancer ([place]) 04/15/2021   left breast   Family history of breast cancer 09/05/2021   Genetic testing 09/15/2021   Negative hereditary cancer genetic testing: no pathogenic variants detected in [facility] CustomNext-Cancer +RNAinsight Panel.  Report date is September 14, 2021.   The CustomNext-Cancer+RNAinsight panel offered by [facility] includes sequencing and rearrangement analysis for the following 47 genes:  APC, ATM, AXIN2, BARD1, BMPR1A, BRCA1, BRCA2, BRIP1, CDH1, CDK4, CDKN2A, CHEK2, DICER1, EPCAM, GREM1, H   Hypothyroidism    PONV (postoperative nausea and vomiting)    Past Surgical History:  Procedure Laterality Date   BREAST LUMPECTOMY WITH SENTINEL LYMPH NODE BIOPSY Left 05/08/2021   Procedure: LEFT BREAST LUMPECTOMY WITH SENTINEL LYMPH NODE BX;  Surgeon: Stark Klein, MD;  Location: [place];  Service: General;  Laterality: Left;   CESAREAN SECTION     x2 (1987 and 1990)   EYE SURGERY Right 2008   clogged tear duct   [place]   Patient Active Problem List   Diagnosis Date Noted   Genetic testing 09/15/2021   Family history of breast cancer 09/05/2021   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 Terrill Mohr  REFERRING PROVIDER: Dr. 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  I think I am better. I have been massaging every day  PERTINENT HISTORY:  Pt underwent left seed localized lumpectomy 05/08/21. Margins were negative and one lymph node was negative. Oncotype score was 7. Genetics were negative.  She received adjuvant radiation followed by exemestane. PAIN:  Are you having pain? 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   OBSERVATIONS / OTHER ASSESSMENTS: enlarged pores around the nipple, blue coloring around nipple from dye.    POSTURE: WNL  UPPER EXTREMITY AROM/PROM:  A/PROM RIGHT   eval   Shoulder extension   Shoulder flexion 159  Shoulder abduction   Shoulder internal rotation   Shoulder external rotation     (Blank rows = not tested)  A/PROM LEFT   eval  Shoulder extension   Shoulder flexion 159  Shoulder abduction WNL  Shoulder internal rotation   Shoulder external rotation     (Blank rows = not tested)  TODAY'S TREATMENT  Pt permission and consent throughout each step of examination and treatment with modification and draping if requested when working on  sensitive areas  01/14/22 Supine short neck, bil axillary nodes, sternal nodes, Lt inguinal nodes, then anterior interaxillary pathway and Lt axilloinguinal pathway and then Left breast with focus on fibrosis medially and then into Rt sidelying for posterior interaxillary work, then repeating all steps in reverse.  STM/MFR to the left axilla, latissimus, pectoralis with PROM with use of cocoa butter. Sidelying abduction AROM with pinning of pectoralis and axilla for release.   01/05/22 With pt permission for breast MLD Education on lymphatic anatomy and drainage patterns of the breast and principles of MLD Performed each step in supine per instruction section below with PT reading and then performing each step and then with hand over hand cueing and performance by pt with modification as needed - noted below.  Made pt foam chip pack for use in fibrotic breast locations with instruction on using 47mn-1 hour to start and then as needed Gave pt new prescription for compression bra and encouraged use as much as possible as the most important component of decreasing breast edema.    PATIENT EDUCATION:  Education details: per daily note above Person educated: Patient Education method: EConsulting civil engineer Demonstration, Tactile cues, Verbal cues, and Handouts Education comprehension: verbalized understanding, returned demonstration, verbal cues required, tactile cues required, and needs further education  HOME EXERCISE PROGRAM: Self MLD, use of foam and bra  ASSESSMENT:  CLINICAL IMPRESSION: Pt has seen improvements already with use of compression bra and self MLD.  Continues with mild medial fibrosis and mild lateral soft edema.  Also demonstrates tightness in the pectoralis and latismus with PROM.    OBJECTIVE IMPAIRMENTS increased edema and increased fascial restrictions.   ACTIVITY LIMITATIONS none  PARTICIPATION LIMITATIONS: none  PERSONAL FACTORS radiation hx  are also affecting patient's  functional outcome.   REHAB POTENTIAL: Excellent  CLINICAL DECISION MAKING: Stable/uncomplicated  EVALUATION COMPLEXITY: Low  GOALS: Goals reviewed with patient? Yes  LONG TERM GOALS: Target date: 02/02/22   Pt will be ind with self MLD for the left breast Baseline:  Goal status: INITIAL  2.  Pt will be ind with compression bra with foam Baseline:  Goal status: MET  3.  Pt will have decrease in breast disability index by at least 10 points Baseline: 25/80 Goal status: INITIAL   PLAN: PT FREQUENCY: 1x/week  PT DURATION: 4 weeks  PLANNED INTERVENTIONS: Therapeutic exercises, Patient/Family education, Self Care, Joint mobilization, DME instructions, Manual lymph drainage, scar mobilization, Taping, Manual therapy, and Re-evaluation  PLAN FOR NEXT SESSION: how was MLD and foam? Get bra? Continue left breast MLD and include some Left MFR as needed   Stark Bray, PT 01/14/2022, 4:57 PM

## 2022-01-20 ENCOUNTER — Ambulatory Visit: Payer: PPO | Attending: General Surgery | Admitting: Rehabilitation

## 2022-01-20 ENCOUNTER — Encounter: Payer: Self-pay | Admitting: Rehabilitation

## 2022-01-20 DIAGNOSIS — C50012 Malignant neoplasm of nipple and areola, left female breast: Secondary | ICD-10-CM | POA: Diagnosis present

## 2022-01-20 DIAGNOSIS — Z170 Estrogen receptor positive status [ER+]: Secondary | ICD-10-CM | POA: Insufficient documentation

## 2022-01-20 DIAGNOSIS — I890 Lymphedema, not elsewhere classified: Secondary | ICD-10-CM | POA: Insufficient documentation

## 2022-01-20 NOTE — Therapy (Signed)
OUTPATIENT PHYSICAL THERAPY ONCOLOGY TREATMENT  Patient Name: Sheila Bryant MRN: [id number] DOB:[birth year], 66 y.o., female Today's Date: 01/20/2022   PT End of Session - 01/20/22 1600     Visit Number 3    Number of Visits 5    PT Start Time 1602    PT Stop Time 7253    PT Time Calculation (min) 41 min    Activity Tolerance Patient tolerated treatment well    Behavior During Therapy [facility] for tasks assessed/performed              Past Medical History:  Diagnosis Date   Cancer ([place]) 04/15/2021   left breast   Family history of breast cancer 09/05/2021   Genetic testing 09/15/2021   Negative hereditary cancer genetic testing: no pathogenic variants detected in [facility] [employer]Next-Cancer +RNAinsight Panel.  Report date is September 14, 2021.   The CustomNext-Cancer+RNAinsight panel offered by [facility] includes sequencing and rearrangement analysis for the following 47 genes:  APC, ATM, AXIN2, BARD1, BMPR1A, BRCA1, BRCA2, BRIP1, CDH1, CDK4, CDKN2A, CHEK2, DICER1, EPCAM, GREM1, H   Hypothyroidism    PONV (postoperative nausea and vomiting)    Past Surgical History:  Procedure Laterality Date   BREAST LUMPECTOMY WITH SENTINEL LYMPH NODE BIOPSY Left 05/08/2021   Procedure: LEFT BREAST LUMPECTOMY WITH SENTINEL LYMPH NODE BX;  Surgeon: Stark Klein, MD;  Location: [place];  Service: General;  Laterality: Left;   CESAREAN SECTION     x2 (1987 and 1990)   EYE SURGERY Right 2008   clogged tear duct   [place]   Patient Active Problem List   Diagnosis Date Noted   Genetic testing 09/15/2021   Family history of breast cancer 09/05/2021   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 Terrill Mohr  REFERRING PROVIDER: Dr. 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  I think it is getting better.  Today it felt heavy  PERTINENT HISTORY:  Pt underwent left seed localized lumpectomy 05/08/21. Margins were negative and one lymph node was negative. Oncotype score was  7. Genetics were negative. She received adjuvant radiation followed by exemestane.  PAIN:  Are you having pain? 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   OBSERVATIONS / OTHER ASSESSMENTS: enlarged pores around the nipple, blue coloring around nipple from dye.    POSTURE: WNL  UPPER EXTREMITY AROM/PROM:  A/PROM RIGHT   eval   Shoulder extension   Shoulder flexion 159  Shoulder abduction   Shoulder internal rotation   Shoulder external rotation     (Blank rows = not tested)  A/PROM LEFT   eval  Shoulder extension   Shoulder flexion 159  Shoulder abduction WNL  Shoulder internal rotation   Shoulder external rotation     (Blank rows = not tested)  TODAY'S TREATMENT  Pt permission and consent throughout each step of examination and treatment with modification and draping if  requested when working on sensitive areas 01/20/22 Supine short neck, bil axillary nodes, sternal nodes, Lt inguinal nodes, then anterior interaxillary pathway and Lt axilloinguinal pathway and then Left breast with focus on fibrosis medially and then into Rt sidelying for posterior interaxillary work, then repeating all steps in reverse.  STM/MFR to the left axilla, latissimus, pectoralis with PROM with use of cocoa butter. Sidelying abduction AROM with pinning of pectoralis and axilla for release.  01/14/22 Supine short neck, bil axillary nodes, sternal nodes, Lt inguinal nodes, then anterior interaxillary pathway and Lt axilloinguinal pathway and then Left breast with focus on fibrosis medially and then into Rt sidelying for posterior interaxillary work, then repeating all steps in reverse.  STM/MFR to the left axilla, latissimus, pectoralis with PROM with use of cocoa butter. Sidelying abduction AROM with pinning of pectoralis and axilla for release.   01/05/22 With pt permission for breast MLD Education on lymphatic anatomy and drainage patterns of the breast and principles of MLD Performed each step in supine per instruction section below with PT reading and then performing each step and then with hand over hand cueing and performance by pt with modification as needed - noted below.  Made pt foam chip pack for use in fibrotic breast locations with instruction on using 63mn-1 hour to start and then as needed Gave pt new prescription for compression bra and encouraged use as much as possible as the most important component of decreasing breast edema.    PATIENT EDUCATION:  Education details: per daily note above Person educated: Patient Education method: Explanation, Demonstration, Tactile cues, Verbal cues, and Handouts Education comprehension: verbalized understanding, returned demonstration, verbal cues required, tactile cues required, and needs further education  HOME EXERCISE PROGRAM: Self  MLD, use of foam and bra  ASSESSMENT:  CLINICAL IMPRESSION: Pt is much improved overall.  Decreased soft tissue tightness and minimal fibrosis overall.  Still mild in the medial inferior breast.    OBJECTIVE IMPAIRMENTS increased edema and increased fascial restrictions.   ACTIVITY LIMITATIONS none  PARTICIPATION LIMITATIONS: none  PERSONAL FACTORS radiation hx  are also affecting patient's functional outcome.   REHAB  POTENTIAL: Excellent  CLINICAL DECISION MAKING: Stable/uncomplicated  EVALUATION COMPLEXITY: Low  GOALS: Goals reviewed with patient? Yes  LONG TERM GOALS: Target date: 02/02/22   Pt will be ind with self MLD for the left breast Baseline:  Goal status: INITIAL  2.  Pt will be ind with compression bra with foam Baseline:  Goal status: MET  3.  Pt will have decrease in breast disability index by at least 10 points Baseline: 25/80 Goal status: INITIAL   PLAN: PT FREQUENCY: 1x/week  PT DURATION: 4 weeks  PLANNED INTERVENTIONS: Therapeutic exercises, Patient/Family education, Self Care, Joint mobilization, DME instructions, Manual lymph drainage, scar mobilization, Taping, Manual therapy, and Re-evaluation  PLAN FOR NEXT SESSION: how was MLD and foam? Get bra? Continue left breast MLD and include some Left MFR as needed   Stark Bray, PT 01/20/2022, 4:54 PM

## 2022-01-21 ENCOUNTER — Other Ambulatory Visit: Payer: BC Managed Care – PPO

## 2022-01-21 ENCOUNTER — Ambulatory Visit: Payer: BC Managed Care – PPO | Admitting: Hematology

## 2022-01-26 ENCOUNTER — Ambulatory Visit: Payer: PPO | Admitting: Physical Therapy

## 2022-01-26 ENCOUNTER — Encounter: Payer: Self-pay | Admitting: Physical Therapy

## 2022-01-26 DIAGNOSIS — C50012 Malignant neoplasm of nipple and areola, left female breast: Secondary | ICD-10-CM | POA: Diagnosis not present

## 2022-01-26 DIAGNOSIS — I890 Lymphedema, not elsewhere classified: Secondary | ICD-10-CM

## 2022-01-26 NOTE — Therapy (Signed)
OUTPATIENT PHYSICAL THERAPY ONCOLOGY TREATMENT  Patient Name: Sheila Bryant MRN: [id number] DOB:21-Jan-1956, 66 y.o., female Today's Date: 01/26/2022   PT End of Session - 01/26/22 1606     Visit Number 4    Number of Visits 5    Date for PT Re-Evaluation 02/02/22    PT Start Time 1606    PT Stop Time 1648    PT Time Calculation (min) 42 min    Activity Tolerance Patient tolerated treatment well    Behavior During Therapy [facility] for tasks assessed/performed              Past Medical History:  Diagnosis Date   Cancer ([place]) 04/15/2021   left breast   Family history of breast cancer 09/05/2021   Genetic testing 09/15/2021   Negative hereditary cancer genetic testing: no pathogenic variants detected in [employer]Next-Cancer +RNAinsight Panel.  Report date is September 14, 2021.   The CustomNext-Cancer+RNAinsight panel offered by Al[facility] Forts includes sequencing and rearrangement analysis for the following 47 genes:  APC, ATM, AXIN2, BARD1, BMPR1A, BRCA1, BRCA2, BRIP1, CDH1, CDK4, CDKN2A, CHEK2, DICER1, EPCAM, GREM1, H   Hypothyroidism    PONV (postoperative nausea and vomiting)    Past Surgical History:  Procedure Laterality Date   BREAST LUMPECTOMY WITH SENTINEL LYMPH NODE BIOPSY Left 05/08/2021   Procedure: LEFT BREAST LUMPECTOMY WITH SENTINEL LYMPH NODE BX;  Surgeon: Stark Klein, MD;  Location: [place];  Service: General;  Laterality: Left;   CESAREAN SECTION     x2 (1987 and 1990)   EYE SURGERY Right 2008   clogged tear duct   [place]   Patient Active Problem List   Diagnosis Date Noted   Genetic testing 09/15/2021   Family history of breast cancer 09/05/2021   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) 05/06/2021    PCP: Dr. Terrill Mohr  REFERRING PROVIDER: Dr. Barry Dienes  REFERRING DIAG: Left breast lymphedema  THERAPY DIAG:  Lymphedema, not elsewhere classified  Malignant neoplasm of areola of  left breast in female, estrogen receptor positive ([place])  ONSET DATE: since surgery 05/08/21  Rationale for Evaluation and Treatment Rehabilitation  SUBJECTIVE                                                                                                                                                                                          SUBJECTIVE STATEMENT:  I think the breast swelling is going well. The compression bra is going great. I have not worn the foam as much since I have gone back to work.  PERTINENT HISTORY:  Pt underwent  left seed localized lumpectomy 05/08/21. Margins were negative and one lymph node was negative. Oncotype score was 7. Genetics were negative. She received adjuvant radiation followed by exemestane.  PAIN:  Are you having pain? 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   OBSERVATIONS / OTHER ASSESSMENTS: enlarged pores around the nipple, blue coloring around nipple from dye.    POSTURE: WNL  UPPER EXTREMITY AROM/PROM:  A/PROM RIGHT   eval   Shoulder extension   Shoulder flexion 159  Shoulder abduction   Shoulder internal rotation   Shoulder external rotation     (Blank rows = not tested)  A/PROM LEFT   eval  Shoulder extension   Shoulder flexion 159  Shoulder abduction WNL  Shoulder internal rotation   Shoulder external rotation     (Blank rows = not tested)  TODAY'S TREATMENT  Pt permission and consent throughout each step of examination and treatment with modification and draping if requested when working on sensitive areas 01/26/22 Supine short neck, right axillary nodes, 5 diaphragmatic breaths, Lt inguinal nodes, then  anterior interaxillary pathway and Lt axilloinguinal pathway and then Left breast with focus on fibrosis medially then repeating all steps in reverse.   STM to the left axilla and lateral edge of pec where pt has increased tightness Instructed pt in corner stretch, doorway stretch and supine over foam roll with arms outstretched all to stretch pec muscle  01/20/22 Supine short neck, bil axillary nodes, sternal nodes, Lt inguinal nodes, then anterior interaxillary pathway and Lt axilloinguinal pathway and then Left breast with focus on fibrosis medially and then into Rt sidelying for posterior interaxillary work, then repeating all steps in reverse.  STM/MFR to the left axilla, latissimus, pectoralis with PROM with use of cocoa butter. Sidelying abduction AROM with pinning of pectoralis and axilla for release.  01/14/22 Supine short neck, bil axillary nodes, sternal nodes, Lt inguinal nodes, then anterior interaxillary pathway and Lt axilloinguinal pathway and then Left breast with focus on fibrosis medially and then into Rt sidelying for posterior interaxillary work, then repeating all steps in reverse.  STM/MFR to the left axilla, latissimus, pectoralis with PROM with use of cocoa butter. Sidelying abduction AROM with pinning of pectoralis and axilla for release.   01/05/22 With pt permission for breast MLD Education on lymphatic anatomy and drainage patterns of the breast and principles of MLD Performed each step in supine per instruction section below with PT reading and then performing each step and then with hand over hand cueing and performance by pt with modification as needed - noted below.  Made pt foam chip pack for use in fibrotic breast locations with instruction on using 32mn-1 hour to start and then as needed Gave pt new prescription for compression bra and encouraged use as much as possible as the most important component of decreasing breast edema.    PATIENT EDUCATION:  Education  details: per daily note above Person educated: Patient Education method: Explanation, Demonstration, Tactile cues, Verbal cues, and Handouts Education comprehension: verbalized understanding, returned demonstration, verbal cues required, tactile cues required, and needs further education  HOME EXERCISE PROGRAM: Self MLD, use of foam and bra, door way stretch, corner  stretch, lying over foam roll with arms outstretched  ASSESSMENT:  CLINICAL IMPRESSION: Pt continues to demonstrates decreased soft tissue tightness and minimal fibrosis overall.  Still mild in the medial inferior breast.  Continued with soft tissue mobilization to edge of pec muscle along axillary border where she has increased tightness and discomfort. Instructed pt in door way, corner stretch and lying over foam roll with arms outstretched.   OBJECTIVE IMPAIRMENTS increased edema and increased fascial restrictions.   ACTIVITY LIMITATIONS none  PARTICIPATION LIMITATIONS: none  PERSONAL FACTORS radiation hx  are also affecting patient's functional outcome.   REHAB POTENTIAL: Excellent  CLINICAL DECISION MAKING: Stable/uncomplicated  EVALUATION COMPLEXITY: Low  GOALS: Goals reviewed with patient? Yes  LONG TERM GOALS: Target date: 02/02/22   Pt will be ind with self MLD for the left breast Baseline:  Goal status: INITIAL  2.  Pt will be ind with compression bra with foam Baseline:  Goal status: MET  3.  Pt will have decrease in breast disability index by at least 10 points Baseline: 25/80 Goal status: INITIAL   PLAN: PT FREQUENCY: 1x/week  PT DURATION: 4 weeks  PLANNED INTERVENTIONS: Therapeutic exercises, Patient/Family education, Self Care, Joint mobilization, DME instructions, Manual lymph drainage, scar mobilization, Taping, Manual therapy, and Re-evaluation  PLAN FOR NEXT SESSION: how was MLD and foam? Get bra? Continue left breast MLD and include some Left MFR as needed   [employer],  PT 01/26/2022, 4:56 PM

## 2022-02-04 ENCOUNTER — Ambulatory Visit: Payer: PPO | Admitting: Rehabilitation

## 2022-02-04 ENCOUNTER — Encounter: Payer: Self-pay | Admitting: Rehabilitation

## 2022-02-04 DIAGNOSIS — C50012 Malignant neoplasm of nipple and areola, left female breast: Secondary | ICD-10-CM

## 2022-02-04 DIAGNOSIS — I890 Lymphedema, not elsewhere classified: Secondary | ICD-10-CM

## 2022-02-04 NOTE — Therapy (Addendum)
 OUTPATIENT PHYSICAL THERAPY ONCOLOGY TREATMENT  Patient Name: Sheila Bryant MRN: [id number] DOB:09-05-1955, 66 y.o., female Today's Date: 02/04/2022   PT End of Session - 02/04/22 2224     Visit Number 5    Number of Visits 5    Date for PT Re-Evaluation 02/02/22    PT Start Time 1600    PT Stop Time 1646    PT Time Calculation (min) 46 min    Activity Tolerance Patient tolerated treatment well    Behavior During Therapy [facility] for tasks assessed/performed               Past Medical History:  Diagnosis Date   Cancer ([facility]) 04/15/2021   left breast   Family history of breast cancer 09/05/2021   Genetic testing 09/15/2021   Negative hereditary cancer genetic testing: no pathogenic variants detected in [facility] [employer]-Cancer +RNAinsight Panel.  Report date is September 14, 2021.   The CustomNext-Cancer+RNAinsight panel offered by [facility] Dupes includes sequencing and rearrangement analysis for the following 47 genes:  APC, ATM, AXIN2, BARD1, BMPR1A, BRCA1, BRCA2, BRIP1, CDH1, CDK4, CDKN2A, CHEK2, DICER1, EPCAM, GREM1, H   Hypothyroidism    PONV (postoperative nausea and vomiting)    Past Surgical History:  Procedure Laterality Date   BREAST LUMPECTOMY WITH SENTINEL LYMPH NODE BIOPSY Left 05/08/2021   Procedure: LEFT BREAST LUMPECTOMY WITH SENTINEL LYMPH NODE BX;  Surgeon: Almond Lint, MD;  Location[facility];  Service: General;  Laterality: Left;   CESAREAN SECTION     x2 (1987 and 1990)   EYE SURGERY Right 2008   clogged tear duct   WISDOM TOOTH EXTRACTION  1977   Patient Active Problem List   Diagnosis Date Noted   Genetic testing 09/15/2021   Family history of breast cancer 09/05/2021   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 Brett Fairy  REFERRING PROVIDER: Dr. Donell Beers  REFERRING DIAG: Left breast lymphedema  THERAPY DIAG:  Lymphedema, not elsewhere classified  Malignant neoplasm of areola of  left breast in female, estrogen receptor positive ([facility])  ONSET DATE: since surgery 05/08/21  Rationale for Evaluation and Treatment Rehabilitation  SUBJECTIVE                                                                                                                                                                                          SUBJECTIVE STATEMENT:  I feel okay.  I think I can be done.   PERTINENT HISTORY:  Pt underwent left seed localized lumpectomy 05/08/21. Margins were negative and one lymph node was negative. Oncotype score was  7. Genetics were negative. She received adjuvant radiation followed by exemestane.  PAIN:  Are you having pain? 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   OBSERVATIONS / OTHER ASSESSMENTS: enlarged pores around the nipple, blue coloring around nipple from dye.    POSTURE: WNL  UPPER EXTREMITY AROM/PROM:  A/PROM RIGHT   eval   Shoulder extension   Shoulder flexion 159  Shoulder abduction   Shoulder internal rotation   Shoulder external rotation     (Blank rows = not tested)  A/PROM LEFT   eval  Shoulder extension   Shoulder flexion 159  Shoulder abduction WNL  Shoulder internal rotation   Shoulder external rotation     (Blank rows = not tested)  TODAY'S TREATMENT  Pt permission and consent throughout each step of examination and treatment with modification and draping if requested when working on sensitive areas  02/04/22 Supine short neck, right axillary nodes, 5 diaphragmatic breaths, Lt inguinal nodes, then anterior interaxillary pathway and Lt axilloinguinal pathway and then Left  breast with focus on fibrosis medially then repeating all steps in reverse.   STM to the left axilla and lateral edge of pec where pt has increased tightness  01/26/22 Supine short neck, right axillary nodes, 5 diaphragmatic breaths, Lt inguinal nodes, then anterior interaxillary pathway and Lt axilloinguinal pathway and then Left breast with focus on fibrosis medially then repeating all steps in reverse.   STM to the left axilla and lateral edge of pec where pt has increased tightness Instructed pt in corner stretch, doorway stretch and supine over foam roll with arms outstretched all to stretch pec muscle  01/20/22 Supine short neck, bil axillary nodes, sternal nodes, Lt inguinal nodes, then anterior interaxillary pathway and Lt axilloinguinal pathway and then Left breast with focus on fibrosis medially and then into Rt sidelying for posterior interaxillary work, then repeating all steps in reverse.  STM/MFR to the left axilla, latissimus, pectoralis with PROM with use of cocoa butter. Sidelying abduction AROM with pinning of pectoralis and axilla for release.  01/14/22 Supine short neck, bil axillary nodes, sternal nodes, Lt inguinal nodes, then anterior interaxillary pathway and Lt axilloinguinal pathway and then Left breast with focus on fibrosis medially and then into Rt sidelying for posterior interaxillary work, then repeating all steps in reverse.  STM/MFR to the left axilla, latissimus, pectoralis with PROM with use of cocoa butter. Sidelying abduction AROM with pinning of pectoralis and axilla for release.   PATIENT EDUCATION:  Education details: per daily note above Person educated: Patient Education method: Programmer, multimedia, Demonstration, Tactile cues, Verbal cues, and Handouts Education comprehension: verbalized understanding, returned demonstration, verbal cues required, tactile cues required, and needs further education  HOME EXERCISE PROGRAM: Self MLD, use of foam and bra, door way  stretch, corner stretch, lying over foam roll with arms outstretched  ASSESSMENT:  CLINICAL IMPRESSION: Has mild fibrosis of the breast but no tenderness.  Pt is now ind with self MLD and use of compression as well as stretches.  Pt knows to return if needed.    OBJECTIVE IMPAIRMENTS increased edema and increased fascial restrictions.   ACTIVITY LIMITATIONS none  PARTICIPATION LIMITATIONS: none  PERSONAL FACTORS radiation hx  are also affecting patient's functional outcome.   REHAB POTENTIAL: Excellent  CLINICAL DECISION MAKING: Stable/uncomplicated  EVALUATION COMPLEXITY: Low  GOALS: Goals reviewed with patient? Yes  LONG TERM GOALS: Target date: 02/02/22   Pt will be ind with self MLD for the left breast Baseline:  Goal status: MET  2.  Pt will be ind with compression bra with foam Baseline:  Goal status: MET  3.  Pt will have decrease in breast disability index by at least 10 points Baseline: 25/80 Goal status: PARTIALLY  MET   PLAN: PT FREQUENCY: 1x/week  PT DURATION: 4 weeks  PLANNED INTERVENTIONS: Therapeutic exercises, Patient/Family education, Self Care, Joint mobilization, DME instructions, Manual lymph drainage, scar mobilization, Taping, Manual therapy, and Re-evaluation  PLAN FOR NEXT SESSION: how was MLD and foam? Get bra? Continue left breast MLD and include some Left MFR as needed   Idamae Lusher, PT 02/04/2022, 10:25 PM  PHYSICAL THERAPY DISCHARGE SUMMARY  Visits from Start of Care: 5  Current functional level related to goals / functional outcomes: See above   Remaining deficits: Breast lymphedema   Education / Equipment: Final HEP  Plan: Patient agrees to discharge.   Patient is being discharged due to meeting the stated rehab goals.

## 2022-02-10 ENCOUNTER — Other Ambulatory Visit: Payer: Self-pay | Admitting: Hematology

## 2022-02-24 ENCOUNTER — Other Ambulatory Visit: Payer: Self-pay

## 2022-02-24 DIAGNOSIS — Z170 Estrogen receptor positive status [ER+]: Secondary | ICD-10-CM

## 2022-02-25 ENCOUNTER — Encounter: Payer: Self-pay | Admitting: Hematology

## 2022-02-25 ENCOUNTER — Other Ambulatory Visit: Payer: Self-pay

## 2022-02-25 ENCOUNTER — Inpatient Hospital Stay: Payer: PPO | Attending: Nurse Practitioner

## 2022-02-25 ENCOUNTER — Inpatient Hospital Stay (HOSPITAL_BASED_OUTPATIENT_CLINIC_OR_DEPARTMENT_OTHER): Payer: PPO | Admitting: Hematology

## 2022-02-25 VITALS — BP 130/81 | HR 63 | Temp 98.2°F | Ht 64.0 in | Wt 151.3 lb

## 2022-02-25 DIAGNOSIS — Z79811 Long term (current) use of aromatase inhibitors: Secondary | ICD-10-CM | POA: Diagnosis not present

## 2022-02-25 DIAGNOSIS — Z923 Personal history of irradiation: Secondary | ICD-10-CM | POA: Insufficient documentation

## 2022-02-25 DIAGNOSIS — C50012 Malignant neoplasm of nipple and areola, left female breast: Secondary | ICD-10-CM | POA: Diagnosis not present

## 2022-02-25 DIAGNOSIS — Z803 Family history of malignant neoplasm of breast: Secondary | ICD-10-CM | POA: Insufficient documentation

## 2022-02-25 DIAGNOSIS — Z853 Personal history of malignant neoplasm of breast: Secondary | ICD-10-CM | POA: Insufficient documentation

## 2022-02-25 DIAGNOSIS — M85851 Other specified disorders of bone density and structure, right thigh: Secondary | ICD-10-CM | POA: Insufficient documentation

## 2022-02-25 DIAGNOSIS — Z170 Estrogen receptor positive status [ER+]: Secondary | ICD-10-CM | POA: Diagnosis not present

## 2022-02-25 DIAGNOSIS — R232 Flushing: Secondary | ICD-10-CM | POA: Diagnosis not present

## 2022-02-25 LAB — CMP (CANCER CENTER ONLY)
ALT: 66 U/L — ABNORMAL HIGH (ref 0–44)
AST: 42 U/L — ABNORMAL HIGH (ref 15–41)
Albumin: 4 g/dL (ref 3.5–5.0)
Alkaline Phosphatase: 109 U/L (ref 38–126)
Anion gap: 6 (ref 5–15)
BUN: 20 mg/dL (ref 8–23)
CO2: 25 mmol/L (ref 22–32)
Calcium: 9.3 mg/dL (ref 8.9–10.3)
Chloride: 107 mmol/L (ref 98–111)
Creatinine: 0.91 mg/dL (ref 0.44–1.00)
GFR, Estimated: >60 mL/min (ref >60)
Glucose, Bld: 81 mg/dL (ref 70–99)
Potassium: 4.1 mmol/L (ref 3.5–5.1)
Sodium: 138 mmol/L (ref 135–145)
Total Bilirubin: 0.2 mg/dL — ABNORMAL LOW (ref 0.3–1.2)
Total Protein: 7.2 g/dL (ref 6.5–8.1)

## 2022-02-25 LAB — CBC WITH DIFFERENTIAL (CANCER CENTER ONLY)
Abs Immature Granulocytes: 0.01 10*3/uL (ref 0.00–0.07)
Basophils Absolute: 0 10*3/uL (ref 0.0–0.1)
Basophils Relative: 1 %
Eosinophils Absolute: 0.1 10*3/uL (ref 0.0–0.5)
Eosinophils Relative: 1 %
HCT: 38.8 % (ref 36.0–46.0)
Hemoglobin: 13.7 g/dL (ref 12.0–15.0)
Immature Granulocytes: 0 %
Lymphocytes Relative: 30 %
Lymphs Abs: 1.4 10*3/uL (ref 0.7–4.0)
MCH: 30.4 pg (ref 26.0–34.0)
MCHC: 35.3 g/dL (ref 30.0–36.0)
MCV: 86 fL (ref 80.0–100.0)
Monocytes Absolute: 0.4 10*3/uL (ref 0.1–1.0)
Monocytes Relative: 9 %
Neutro Abs: 2.8 10*3/uL (ref 1.7–7.7)
Neutrophils Relative %: 59 %
Platelet Count: 249 10*3/uL (ref 150–400)
RBC: 4.51 MIL/uL (ref 3.87–5.11)
RDW: 12.9 % (ref 11.5–15.5)
WBC Count: 4.7 10*3/uL (ref 4.0–10.5)
nRBC: 0 % (ref 0.0–0.2)

## 2022-02-25 NOTE — Progress Notes (Signed)
[place]   [phone] [phone]   Clinic Follow up Note   Patient Care Team: Sheila Major, MD as PCP - General (Family Medicine) Sheila Merle, MD as Consulting Physician (Hematology) Sheila Klein, MD as Consulting Physician (General Surgery) Sheila Rudd, MD as Consulting Physician (Radiation Oncology) Sheila Kaufmann, RN as Oncology Nurse Navigator Sheila Germany, RN as Oncology Nurse Navigator Sheila Rea, MD as Consulting Physician (Obstetrics and Gynecology) Sheila Feeling, NP as Nurse Practitioner (Nurse Practitioner)  Date of Service:  02/25/2022  CHIEF COMPLAINT: f/u of left breast cancer  CURRENT THERAPY:  Surveillance  ASSESSMENT & PLAN:  Sheila Bryant is a 66 y.o. post-menopausal female with   1. 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 S/p left lumpectomy on 05/08/21 by Dr. Barry Dienes showed 1.3 cm IDC, grade 2, with intermediate grade DCIS. Margins and lymph node negative. -oncotype RS was 7, indicating low risk.  -s/p radiation therapy under Dr. Lisbeth Renshaw 1/3-1/31/23. -she started exemestane in 07/2021, discontinued by herself on 11/13/21 due to joint pain at tail bone and osteopenia. -we discussed tamoxifen as an alternative antiestrogen therapy. We reviewed her oncotype test, which showed low risk for metastatic recurrence, which is not curable if that happens. I encouraged her to consider. After discussion, she would prefer not to proceed with additional antiestrogen therapy, due to the concern of potential AEs.  -she is clinically doing well from a breast cancer standpoint. Lab reviewed, overall WNL. Physical exam was unremarkable. There is no clinical concern for recurrence.  -f/u in 6 months, will review tamoxifen again at that time.   2. Hot flashes -previously managed with HRT -severe, interrupt her sleep. -Improved  after she came off exemestane   3.  Osteopenia -new baseline DEXA on 11/13/21 showed osteopenia, with T-score -2.1 at right femur neck -she is taking calcium/vit D combo twice a week. I encouraged her to take vit D on the other 5 days   4. Genetics  -she has family history of breast cancer in her mother and maternal aunt -genetic testing on 09/04/21 was negative.     PLAN:  -She has stopped exemestane a few months ago, and declined other antiestrogen therapy.   -Lab and f/u in 6 months -Next mammogram scheduled for April 16, 2022   No problem-specific Assessment & Plan notes found for this encounter.   SUMMARY OF ONCOLOGIC HISTORY: Oncology History Overview Note   Cancer Staging  Malignant neoplasm of areola of left breast in female, estrogen receptor positive ([name]) Staging form: Breast, AJCC 8th Edition - Clinical stage from 05/06/2021: Stage IA (cT1b, cN0, cM0, G3, ER+, PR+, HER2-) - Unsigned    Malignant neoplasm of areola of left breast in female, estrogen receptor positive ([name])  04/10/2021 Mammogram   EXAM: DIGITAL DIAGNOSTIC BILATERAL MAMMOGRAM WITH TOMOSYNTHESIS AND CAD; ULTRASOUND LEFT BREAST LIMITED; ULTRASOUND RIGHT BREAST LIMITED  IMPRESSION: 1. Suspicious palpable 0.9 cm mass in the 3 o'clock retroareolar region of the LEFT breast for which biopsy is recommended. 2. LEFT axilla is negative.   04/15/2021 Pathology Results   Diagnosis Breast, left, needle core biopsy, 3 o'clock retroareolar - INVASIVE DUCTAL CARCINOMA - SEE COMMENT Microscopic Comment Based on the biopsy, the carcinoma appears Nottingham grade 2-3 of 3 and measures 0.7 cm in greatest linear extent.  PROGNOSTIC INDICATORS Results: The tumor cells are EQUIVOCAL for Her2 (2+). Her2 by FISH  will be performed and results reported separately. Estrogen Receptor: 100%, POSITIVE, STRONG STAINING INTENSITY Progesterone Receptor: 100%, POSITIVE, STRONG STAINING INTENSITY Proliferation Marker Ki67:  15%  FLUORESCENCE IN-SITU HYBRIDIZATION Results: GROUP 5: HER2 **NEGATIVE**   05/06/2021 Initial Diagnosis   Malignant neoplasm of areola of left breast in female, estrogen receptor positive ([place])   05/08/2021 Definitive Surgery   FINAL MICROSCOPIC DIAGNOSIS:   A. BREAST, LEFT, LUMPECTOMY:  - Invasive ductal carcinoma, 1.3 cm, grade 2  - Ductal carcinoma in situ, intermediate grade  - Resection margins are negative for carcinoma - closest is the anterior margin at 0.2 cm  - Biopsy site changes  - See oncology table   B. LYMPH NODE, LEFT AXILLARY, SENTINEL, EXCISION:  - Lymph node, negative for carcinoma (0/1)    05/08/2021 Miscellaneous   Oncotype DX recurrence score of 7 predicts a risk of recurrence outside the breast over the next 9 years of 3%.    06/24/2021 - 07/22/2021 Radiation Therapy   Site Technique Total Dose (Gy) Dose per Fx (Gy) Completed Fx Beam Energies  Breast, Left: Breast_L 3D 42.56/42.56 2.66 16/16 6X, 10X  Breast, Left: Breast_L_Bst 3D 8/8 2 4/4 10X     07/2021 -  Anti-estrogen oral therapy   Began adjuvant exemestane    09/15/2021 Genetic Testing   Negative hereditary cancer genetic testing: no pathogenic variants detected in [facility]-Cancer +RNAinsight Panel.  Report date is September 14, 2021.   The CustomNext-Cancer+RNAinsight panel offered by [facility] includes sequencing and rearrangement analysis for the following 47 genes:  APC, ATM, AXIN2, BARD1, BMPR1A, BRCA1, BRCA2, BRIP1, CDH1, CDK4, CDKN2A, CHEK2, DICER1, EPCAM, GREM1, HOXB13, MEN1, MLH1, MSH2, MSH3, MSH6, MUTYH, NBN, NF1, NF2, NTHL1, PALB2, PMS2, POLD1, POLE, PTEN, RAD51C, RAD51D, RECQL, RET, SDHA, SDHAF2, SDHB, SDHC, SDHD, SMAD4, SMARCA4, STK11, TP53, TSC1, TSC2, and VHL.  RNA data is routinely analyzed for use in variant interpretation for all genes.   11/04/2021 Cancer Staging   Staging form: Breast, AJCC 8th Edition - Pathologic: Stage IA (pT1c, pN0, cM0, G2, ER+, PR+, HER2-, Oncotype  DX score: 7) - Signed by Sheila Feeling, NP on 11/04/2021 Multigene prognostic tests performed: Oncotype DX Recurrence score range: Less than 11 Histologic grading system: 3 grade system   11/04/2021 Survivorship   SCP delivered by Cira Rue, NP      INTERVAL HISTORY:  Sheila Bryant is here for a follow up of breast cancer. She was last seen by me on 07/24/21 with survivorship in the interim. She presents to the clinic alone. She reports she is doing well overall. She tells me she stopped the exemestane in late 10/2021. She explains she developed pain to her tailbone about a month after starting the medicine. She feels it has gotten better since coming off exemestane.   All other systems were reviewed with the patient and are negative.  MEDICAL HISTORY:  Past Medical History:  Diagnosis Date   Cancer ([place]) 04/15/2021   left breast   Family history of breast cancer 09/05/2021   Genetic testing 09/15/2021   Negative hereditary cancer genetic testing: no pathogenic variants detected in [facility]Cancer +RNAinsight Panel.  Report date is September 14, 2021.   The CustomNext-Cancer+RNAinsight panel offered by [facility] includes sequencing and rearrangement analysis for the following 47 genes:  APC, ATM, AXIN2, BARD1, BMPR1A, BRCA1, BRCA2, BRIP1, CDH1, CDK4, CDKN2A, CHEK2, DICER1, EPCAM, GREM1, H   Hypothyroidism    PONV (postoperative nausea and vomiting)     SURGICAL HISTORY: Past Surgical  History:  Procedure Laterality Date   BREAST LUMPECTOMY WITH SENTINEL LYMPH NODE BIOPSY Left 05/08/2021   Procedure: LEFT BREAST LUMPECTOMY WITH SENTINEL LYMPH NODE BX;  Surgeon: Sheila Klein, MD;  Location: [facility];  Service: General;  Laterality: Left;   CESAREAN SECTION     x2 (1987 and 1990)   EYE SURGERY Right 2008   clogged tear duct   WISDOM TOOTH EXTRACTION  1977    I have reviewed the social history and family history with the patient and they are unchanged from previous  note.  ALLERGIES:  is allergic to tape.  MEDICATIONS:  Current Outpatient Medications  Medication Sig Dispense Refill   cetirizine (ZYRTEC) 10 MG tablet Take 10 mg by mouth daily.     exemestane (AROMASIN) 25 MG tablet TAKE 1 TABLET(25 MG) BY MOUTH DAILY AFTER AND BREAKFAST 30 tablet 3   fluticasone (FLONASE) 50 MCG/ACT nasal spray Place 1 spray into both nostrils daily.     gabapentin (NEURONTIN) 100 MG capsule TAKE 2 CAPSULES(200 MG) BY MOUTH AT BEDTIME 60 capsule 1   meclizine (ANTIVERT) 25 MG tablet Take 25 mg by mouth 3 (three) times daily as needed for dizziness.     SYNTHROID 75 MCG tablet Take 75 mcg by mouth daily before breakfast.     No current facility-administered medications for this visit.    PHYSICAL EXAMINATION: ECOG PERFORMANCE STATUS: 0 - Asymptomatic  Vitals:   02/25/22 1435  BP: 130/81  Pulse: 63  Temp: 98.2 F (36.8 C)  SpO2: 98%   Wt Readings from Last 3 Encounters:  02/25/22 151 lb 4.8 oz (68.6 kg)  11/04/21 149 lb 11.2 oz (67.9 kg)  07/24/21 150 lb (68 kg)     GENERAL:alert, no distress and comfortable SKIN: skin color, texture, turgor are normal, no rashes or significant lesions EYES: normal, Conjunctiva are pink and non-injected, sclera clear  NECK: supple, thyroid normal size, non-tender, without nodularity LYMPH:  no palpable lymphadenopathy in the cervical, axillary LUNGS: clear to auscultation and percussion with normal breathing effort HEART: regular rate & rhythm and no murmurs and no lower extremity edema ABDOMEN:abdomen soft, non-tender and normal bowel sounds Musculoskeletal:no cyanosis of digits and no clubbing  NEURO: alert & oriented x 3 with fluent speech, no focal motor/sensory deficits BREAST: No palpable mass, nodules or adenopathy bilaterally. Breast exam benign.   LABORATORY DATA:  I have reviewed the data as listed    Latest Ref Rng & Units 02/25/2022    2:01 PM 05/01/2021    3:39 PM  CBC  WBC 4.0 - 10.5 K/uL 4.7  6.1    Hemoglobin 12.0 - 15.0 g/dL 13.7  14.8   Hematocrit 36.0 - 46.0 % 38.8  44.1   Platelets 150 - 400 K/uL 249  265         Latest Ref Rng & Units 02/25/2022    2:01 PM  CMP  Glucose 70 - 99 mg/dL 81   BUN 8 - 23 mg/dL 20   Creatinine 0.44 - 1.00 mg/dL 0.91   Sodium 135 - 145 mmol/L 138   Potassium 3.5 - 5.1 mmol/L 4.1   Chloride 98 - 111 mmol/L 107   CO2 22 - 32 mmol/L 25   Calcium 8.9 - 10.3 mg/dL 9.3   Total Protein 6.5 - 8.1 g/dL 7.2   Total Bilirubin 0.3 - 1.2 mg/dL 0.2   Alkaline Phos 38 - 126 U/L 109   AST 15 - 41 U/L 42   ALT 0 -  44 U/L 66       RADIOGRAPHIC STUDIES: I have personally reviewed the radiological images as listed and agreed with the findings in the report. No results found.    No orders of the defined types were placed in this encounter.  All questions were answered. The patient knows to call the clinic with any problems, questions or concerns. No barriers to learning was detected. The total time spent in the appointment was 30 minutes.     Sheila Merle, MD 02/25/2022   I, Wilburn Mylar, am acting as scribe for Sheila Merle, MD.   I have reviewed the above documentation for accuracy and completeness, and I agree with the above.

## 2022-03-06 ENCOUNTER — Telehealth: Payer: Self-pay

## 2022-03-06 NOTE — Telephone Encounter (Addendum)
Spoke with patient to advise of Dr. Ernestina Penna message below. Patient agreed to repeat CMP as recommended.   Will route to scheduler.  ----- Message from Evalee Jefferson, RN sent at 03/06/2022 11:53 AM EDT -----  ----- Message ----- From: Truitt Merle, MD Sent: 03/03/2022  10:14 AM EDT To: Estella Husk, LPN; Evalee Jefferson, RN  Please let he know her liver enzymes were slightly elevated, which is new. I recommend repeating lab CMP in a few months, please schedule if she agrees.   Truitt Merle

## 2022-03-19 ENCOUNTER — Telehealth: Payer: Self-pay | Admitting: Hematology

## 2022-03-19 NOTE — Telephone Encounter (Signed)
Scheduled follow-up appointment per 9/15 patient call message. Patient is aware.

## 2022-04-16 ENCOUNTER — Ambulatory Visit
Admission: RE | Admit: 2022-04-16 | Discharge: 2022-04-16 | Disposition: A | Discharge status: Home / Self Care | Payer: PPO | Source: Ambulatory Visit | Attending: Nurse Practitioner | Admitting: Nurse Practitioner

## 2022-04-16 DIAGNOSIS — Z170 Estrogen receptor positive status [ER+]: Secondary | ICD-10-CM

## 2022-04-16 HISTORY — DX: Personal history of irradiation: Z92.3

## 2022-04-27 ENCOUNTER — Inpatient Hospital Stay: Payer: PPO | Attending: Nurse Practitioner

## 2022-04-27 ENCOUNTER — Encounter: Payer: Self-pay | Admitting: Hematology

## 2022-04-27 DIAGNOSIS — Z853 Personal history of malignant neoplasm of breast: Secondary | ICD-10-CM | POA: Insufficient documentation

## 2022-04-27 DIAGNOSIS — M85851 Other specified disorders of bone density and structure, right thigh: Secondary | ICD-10-CM | POA: Insufficient documentation

## 2022-04-27 DIAGNOSIS — C50012 Malignant neoplasm of nipple and areola, left female breast: Secondary | ICD-10-CM

## 2022-04-27 LAB — CMP (CANCER CENTER ONLY)
ALT: 38 U/L (ref 0–44)
AST: 26 U/L (ref 15–41)
Albumin: 4.5 g/dL (ref 3.5–5.0)
Alkaline Phosphatase: 130 U/L — ABNORMAL HIGH (ref 38–126)
Anion gap: 8 (ref 5–15)
BUN: 15 mg/dL (ref 8–23)
CO2: 28 mmol/L (ref 22–32)
Calcium: 9.8 mg/dL (ref 8.9–10.3)
Chloride: 105 mmol/L (ref 98–111)
Creatinine: 0.78 mg/dL (ref 0.44–1.00)
GFR, Estimated: >60 mL/min (ref >60)
Glucose, Bld: 125 mg/dL — ABNORMAL HIGH (ref 70–99)
Potassium: 3.6 mmol/L (ref 3.5–5.1)
Sodium: 141 mmol/L (ref 135–145)
Total Bilirubin: 0.4 mg/dL (ref 0.3–1.2)
Total Protein: 7.9 g/dL (ref 6.5–8.1)

## 2022-04-27 LAB — CBC WITH DIFFERENTIAL (CANCER CENTER ONLY)
Abs Immature Granulocytes: 0 10*3/uL (ref 0.00–0.07)
Basophils Absolute: 0 10*3/uL (ref 0.0–0.1)
Basophils Relative: 1 %
Eosinophils Absolute: 0.1 10*3/uL (ref 0.0–0.5)
Eosinophils Relative: 1 %
HCT: 42.3 % (ref 36.0–46.0)
Hemoglobin: 14.7 g/dL (ref 12.0–15.0)
Immature Granulocytes: 0 %
Lymphocytes Relative: 30 %
Lymphs Abs: 1.6 10*3/uL (ref 0.7–4.0)
MCH: 30.2 pg (ref 26.0–34.0)
MCHC: 34.8 g/dL (ref 30.0–36.0)
MCV: 87 fL (ref 80.0–100.0)
Monocytes Absolute: 0.3 10*3/uL (ref 0.1–1.0)
Monocytes Relative: 6 %
Neutro Abs: 3.4 10*3/uL (ref 1.7–7.7)
Neutrophils Relative %: 62 %
Platelet Count: 235 10*3/uL (ref 150–400)
RBC: 4.86 MIL/uL (ref 3.87–5.11)
RDW: 12.5 % (ref 11.5–15.5)
WBC Count: 5.4 10*3/uL (ref 4.0–10.5)
nRBC: 0 % (ref 0.0–0.2)

## 2022-08-06 ENCOUNTER — Other Ambulatory Visit: Payer: Self-pay | Admitting: Hematology

## 2022-08-17 ENCOUNTER — Other Ambulatory Visit: Payer: Self-pay

## 2022-08-24 ENCOUNTER — Other Ambulatory Visit: Payer: Self-pay | Admitting: Hematology

## 2022-08-24 MED ORDER — GABAPENTIN 100 MG PO CAPS: ORAL_CAPSULE | ORAL | 2 refills | Status: DC

## 2022-08-26 NOTE — Progress Notes (Unsigned)
Patient Care Team: Nickola Major, MD as PCP - General (Family Medicine) Truitt Merle, MD as Consulting Physician (Hematology) Stark Klein, MD as Consulting Physician (General Surgery) Kyung Rudd, MD as Consulting Physician (Radiation Oncology) Mauro Kaufmann, RN as Oncology Nurse Navigator Rockwell Germany, RN as Oncology Nurse Navigator Vania Rea, MD as Consulting Physician (Obstetrics and Gynecology) Alla Feeling, NP as Nurse Practitioner (Nurse Practitioner)   CHIEF COMPLAINT: Follow-up left breast cancer  Oncology History Overview Note   Cancer Staging  Malignant neoplasm of areola of left breast in female, estrogen receptor positive [facility]) Staging form: Breast, AJCC 8th Edition - Clinical stage from 05/06/2021: Stage IA (cT1b, cN0, cM0, G3, ER+, PR+, HER2-) - Unsigned    Malignant neoplasm of areola of left breast in female, estrogen receptor positive ([place])  04/10/2021 Mammogram   EXAM: DIGITAL DIAGNOSTIC BILATERAL MAMMOGRAM WITH TOMOSYNTHESIS AND CAD; ULTRASOUND LEFT BREAST LIMITED; ULTRASOUND RIGHT BREAST LIMITED  IMPRESSION: 1. Suspicious palpable 0.9 cm mass in the 3 o'clock retroareolar region of the LEFT breast for which biopsy is recommended. 2. LEFT axilla is negative.   04/15/2021 Pathology Results   Diagnosis Breast, left, needle core biopsy, 3 o'clock retroareolar - INVASIVE DUCTAL CARCINOMA - SEE COMMENT Microscopic Comment Based on the biopsy, the carcinoma appears Nottingham grade 2-3 of 3 and measures 0.7 cm in greatest linear extent.  PROGNOSTIC INDICATORS Results: The tumor cells are EQUIVOCAL for Her2 (2+). Her2 by FISH will be performed and results reported separately. Estrogen Receptor: 100%, POSITIVE, STRONG STAINING INTENSITY Progesterone Receptor: 100%, POSITIVE, STRONG STAINING INTENSITY Proliferation Marker Ki67: 15%  FLUORESCENCE IN-SITU HYBRIDIZATION Results: GROUP 5: HER2 **NEGATIVE**   05/06/2021 Initial Diagnosis    Malignant neoplasm of areola of left breast in female, estrogen receptor positive ([place])   05/08/2021 Definitive Surgery   FINAL MICROSCOPIC DIAGNOSIS:   A. BREAST, LEFT, LUMPECTOMY:  - Invasive ductal carcinoma, 1.3 cm, grade 2  - Ductal carcinoma in situ, intermediate grade  - Resection margins are negative for carcinoma - closest is the anterior margin at 0.2 cm  - Biopsy site changes  - See oncology table   B. LYMPH NODE, LEFT AXILLARY, SENTINEL, EXCISION:  - Lymph node, negative for carcinoma (0/1)    05/08/2021 Miscellaneous   Oncotype DX recurrence score of 7 predicts a risk of recurrence outside the breast over the next 9 years of 3%.    06/24/2021 - 07/22/2021 Radiation Therapy   Site Technique Total Dose (Gy) Dose per Fx (Gy) Completed Fx Beam Energies  Breast, Left: Breast_L 3D 42.56/42.56 2.66 16/16 6X, 10X  Breast, Left: Breast_L_Bst 3D 8/8 2 4/4 10X     07/2021 -  Anti-estrogen oral therapy   Began adjuvant exemestane    09/15/2021 Genetic Testing   Negative hereditary cancer genetic testing: no pathogenic variants detected in [facility]Next-Cancer +RNAinsight Panel.  Report date is September 14, 2021.   The CustomNext-Cancer+RNAinsight panel offered by [facility] includes sequencing and rearrangement analysis for the following 47 genes:  APC, ATM, AXIN2, BARD1, BMPR1A, BRCA1, BRCA2, BRIP1, CDH1, CDK4, CDKN2A, CHEK2, DICER1, EPCAM, GREM1, HOXB13, MEN1, MLH1, MSH2, MSH3, MSH6, MUTYH, NBN, NF1, NF2, NTHL1, PALB2, PMS2, POLD1, POLE, PTEN, RAD51C, RAD51D, RECQL, RET, SDHA, SDHAF2, SDHB, SDHC, SDHD, SMAD4, SMARCA4, STK11, TP53, TSC1, TSC2, and VHL.  RNA data is routinely analyzed for use in variant interpretation for all genes.   11/04/2021 Cancer Staging   Staging form: Breast, AJCC 8th Edition - Pathologic: Stage IA (pT1c, pN0, cM0, G2,  ER+, PR+, HER2-, Oncotype DX score: 7) - Signed by Alla Feeling, NP on 11/04/2021 Multigene prognostic tests performed: Oncotype  DX Recurrence score range: Less than 11 Histologic grading system: 3 grade system   11/04/2021 Survivorship   SCP delivered by Cira Rue, NP      CURRENT THERAPY: Surveillance  INTERVAL HISTORY Ms. Merrick returns for follow-up as scheduled, last seen by Dr. Burr Medico 02/25/2022.  Mammogram 04/16/2022 showed postsurgical and radiation changes and breast density category C, otherwise negative.   ROS   Past Medical History:  Diagnosis Date   Cancer ([place]) 04/15/2021   left breast   Family history of breast cancer 09/05/2021   Genetic testing 09/15/2021   Negative hereditary cancer genetic testing: no pathogenic variants detected in [facility]-Cancer +RNAinsight Panel.  Report date is September 14, 2021.   The CustomNext-Cancer+RNAinsight panel offered by [facility] includes sequencing and rearrangement analysis for the following 47 genes:  APC, ATM, AXIN2, BARD1, BMPR1A, BRCA1, BRCA2, BRIP1, CDH1, CDK4, CDKN2A, CHEK2, DICER1, EPCAM, GREM1, H   Hypothyroidism    Personal history of radiation therapy    PONV (postoperative nausea and vomiting)      Past Surgical History:  Procedure Laterality Date   BREAST LUMPECTOMY WITH SENTINEL LYMPH NODE BIOPSY Left 05/08/2021   Procedure: LEFT BREAST LUMPECTOMY WITH SENTINEL LYMPH NODE BX;  Surgeon: Stark Klein, MD;  Location: [facility];  Service: General;  Laterality: Left;   CESAREAN SECTION     x2 (1987 and 1990)   EYE SURGERY Right 2008   clogged tear duct   WISDOM TOOTH EXTRACTION  1977     Outpatient Encounter Medications as of 08/27/2022  Medication Sig   cetirizine (ZYRTEC) 10 MG tablet Take 10 mg by mouth daily.   exemestane (AROMASIN) 25 MG tablet TAKE 1 TABLET(25 MG) BY MOUTH DAILY AFTER AND BREAKFAST   fluticasone (FLONASE) 50 MCG/ACT nasal spray Place 1 spray into both nostrils daily.   gabapentin (NEURONTIN) 100 MG capsule TAKE 2 CAPSULES(200 MG) BY MOUTH AT BEDTIME   meclizine (ANTIVERT) 25 MG tablet Take 25 mg by mouth 3 (three)  times daily as needed for dizziness.   SYNTHROID 75 MCG tablet Take 75 mcg by mouth daily before breakfast.   No facility-administered encounter medications on file as of 08/27/2022.     There were no vitals filed for this visit. There is no height or weight on file to calculate BMI.   PHYSICAL EXAM GENERAL:alert, no distress and comfortable SKIN: no rash  EYES: sclera clear NECK: without mass LYMPH:  no palpable cervical or supraclavicular lymphadenopathy  LUNGS: clear with normal breathing effort HEART: regular rate & rhythm, no lower extremity edema ABDOMEN: abdomen soft, non-tender and normal bowel sounds NEURO: alert & oriented x 3 with fluent speech, no focal motor/sensory deficits Breast exam:  PAC without erythema    CBC    Component Value Date/Time   WBC 5.4 04/27/2022 1542   WBC 6.1 05/01/2021 1539   RBC 4.86 04/27/2022 1542   HGB 14.7 04/27/2022 1542   HCT 42.3 04/27/2022 1542   PLT 235 04/27/2022 1542   MCV 87.0 04/27/2022 1542   MCH 30.2 04/27/2022 1542   MCHC 34.8 04/27/2022 1542   RDW 12.5 04/27/2022 1542   LYMPHSABS 1.6 04/27/2022 1542   MONOABS 0.3 04/27/2022 1542   EOSABS 0.1 04/27/2022 1542   BASOSABS 0.0 04/27/2022 1542     CMP     Component Value Date/Time   NA 141 04/27/2022 1542  K 3.6 04/27/2022 1542   CL 105 04/27/2022 1542   CO2 28 04/27/2022 1542   GLUCOSE 125 (H) 04/27/2022 1542   BUN 15 04/27/2022 1542   CREATININE 0.78 04/27/2022 1542   CALCIUM 9.8 04/27/2022 1542   PROT 7.9 04/27/2022 1542   ALBUMIN 4.5 04/27/2022 1542   AST 26 04/27/2022 1542   ALT 38 04/27/2022 1542   ALKPHOS 130 (H) 04/27/2022 1542   BILITOT 0.4 04/27/2022 1542   GFRNONAA >60 04/27/2022 1542     ASSESSMENT & PLAN:  PLAN:  No orders of the defined types were placed in this encounter.     All questions were answered. The patient knows to call the clinic with any problems, questions or concerns. No barriers to learning were detected. I spent ***  counseling the patient face to face. The total time spent in the appointment was *** and more than 50% was on counseling, review of test results, and coordination of care.   Cira Rue, NP-C '@DATE'$ @

## 2022-08-27 ENCOUNTER — Encounter: Payer: Self-pay | Admitting: Nurse Practitioner

## 2022-08-27 ENCOUNTER — Inpatient Hospital Stay: Payer: PPO | Attending: Nurse Practitioner

## 2022-08-27 ENCOUNTER — Inpatient Hospital Stay (HOSPITAL_BASED_OUTPATIENT_CLINIC_OR_DEPARTMENT_OTHER): Payer: PPO | Admitting: Nurse Practitioner

## 2022-08-27 VITALS — BP 146/83 | HR 60 | Temp 98.0°F | Resp 18 | Wt 148.6 lb

## 2022-08-27 DIAGNOSIS — Z170 Estrogen receptor positive status [ER+]: Secondary | ICD-10-CM | POA: Insufficient documentation

## 2022-08-27 DIAGNOSIS — C50012 Malignant neoplasm of nipple and areola, left female breast: Secondary | ICD-10-CM | POA: Diagnosis not present

## 2022-08-27 DIAGNOSIS — M85851 Other specified disorders of bone density and structure, right thigh: Secondary | ICD-10-CM | POA: Diagnosis not present

## 2022-08-27 DIAGNOSIS — R232 Flushing: Secondary | ICD-10-CM | POA: Diagnosis not present

## 2022-08-27 DIAGNOSIS — Z923 Personal history of irradiation: Secondary | ICD-10-CM | POA: Diagnosis not present

## 2022-08-27 LAB — CBC WITH DIFFERENTIAL (CANCER CENTER ONLY)
Abs Immature Granulocytes: 0 10*3/uL (ref 0.00–0.07)
Basophils Absolute: 0 10*3/uL (ref 0.0–0.1)
Basophils Relative: 0 %
Eosinophils Absolute: 0 10*3/uL (ref 0.0–0.5)
Eosinophils Relative: 1 %
HCT: 42 % (ref 36.0–46.0)
Hemoglobin: 14.6 g/dL (ref 12.0–15.0)
Immature Granulocytes: 0 %
Lymphocytes Relative: 36 %
Lymphs Abs: 1.2 10*3/uL (ref 0.7–4.0)
MCH: 30.2 pg (ref 26.0–34.0)
MCHC: 34.8 g/dL (ref 30.0–36.0)
MCV: 86.8 fL (ref 80.0–100.0)
Monocytes Absolute: 0.4 10*3/uL (ref 0.1–1.0)
Monocytes Relative: 11 %
Neutro Abs: 1.7 10*3/uL (ref 1.7–7.7)
Neutrophils Relative %: 52 %
Platelet Count: 216 10*3/uL (ref 150–400)
RBC: 4.84 MIL/uL (ref 3.87–5.11)
RDW: 12.7 % (ref 11.5–15.5)
WBC Count: 3.3 10*3/uL — ABNORMAL LOW (ref 4.0–10.5)
nRBC: 0 % (ref 0.0–0.2)

## 2022-08-27 LAB — CMP (CANCER CENTER ONLY)
ALT: 50 U/L — ABNORMAL HIGH (ref 0–44)
AST: 37 U/L (ref 15–41)
Albumin: 4.3 g/dL (ref 3.5–5.0)
Alkaline Phosphatase: 110 U/L (ref 38–126)
Anion gap: 6 (ref 5–15)
BUN: 12 mg/dL (ref 8–23)
CO2: 28 mmol/L (ref 22–32)
Calcium: 9.1 mg/dL (ref 8.9–10.3)
Chloride: 106 mmol/L (ref 98–111)
Creatinine: 0.69 mg/dL (ref 0.44–1.00)
GFR, Estimated: >60 mL/min (ref >60)
Glucose, Bld: 83 mg/dL (ref 70–99)
Potassium: 4 mmol/L (ref 3.5–5.1)
Sodium: 140 mmol/L (ref 135–145)
Total Bilirubin: 0.6 mg/dL (ref 0.3–1.2)
Total Protein: 7.3 g/dL (ref 6.5–8.1)

## 2022-08-27 MED ORDER — GABAPENTIN 100 MG PO CAPS: ORAL_CAPSULE | ORAL | 2 refills | Status: DC

## 2022-11-14 ENCOUNTER — Other Ambulatory Visit: Payer: Self-pay | Admitting: Hematology

## 2022-12-08 ENCOUNTER — Telehealth: Payer: Self-pay

## 2022-12-08 NOTE — Telephone Encounter (Signed)
Faxed signed orders to [facility] as per Dr. Mosetta Putt. Placed the original order in the to be scanned file.

## 2023-02-22 NOTE — Progress Notes (Unsigned)
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 Suspicious palpable 0.9 cm mass in the 3 o'clock retroareolar region of the LEFT breast for which biopsy is recommended. 2. LEFT axilla is negative.   04/15/2021 Pathology Results   Diagnosis Breast, left, needle core biopsy, 3 o'clock retroareolar - INVASIVE DUCTAL CARCINOMA - SEE COMMENT Microscopic Comment Based on the biopsy, the carcinoma appears Nottingham grade 2-3 of 3 and measures 0.7 cm in greatest linear extent.  PROGNOSTIC INDICATORS Results: The tumor cells are EQUIVOCAL for Her2 (2+). Her2 by FISH will be performed and results reported separately. 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 BREAST, LEFT, LUMPECTOMY:  - Invasive ductal carcinoma, 1.3 cm, grade 2  - Ductal carcinoma in situ, intermediate grade  - Resection margins are negative for carcinoma - closest is the anterior margin at 0.2 cm  - Biopsy site changes  - See oncology table   B. LYMPH NODE, LEFT AXILLARY, SENTINEL, EXCISION:  - Lymph node, negative for carcinoma (0/1)    05/08/2021 Miscellaneous   Oncotype DX recurrence score of 7 predicts a risk of recurrence outside the breast over the next 9 years of 3%.    06/24/2021 - 07/22/2021 Radiation Therapy   Site Technique Total Dose (Gy) Dose per Fx (Gy) Completed Fx Beam Energies  Breast, Left: Breast_L 3D 42.56/42.56 2.66 16/16 6X, 10X  Breast, Left: Breast_L_Bst 3D 8/8 2 4/4 10X     07/2021 -  Anti-estrogen oral therapy   Began adjuvant exemestane    09/15/2021 Genetic Testing   Negative hereditary cancer genetic testing: no pathogenic variants detected in [facility] Bryant +RNAinsight Panel.  Report date is September 14, 2021.   The [facility]+RNAinsight panel offered by [employer] includes sequencing and rearrangement analysis for the following 47 genes:  APC, ATM, AXIN2, BARD1, BMPR1A, BRCA1, BRCA2, BRIP1, CDH1, CDK4, CDKN2A, CHEK2, DICER1, EPCAM, GREM1, HOXB13, MEN1, MLH1, MSH2, MSH3, MSH6, MUTYH, NBN, NF1, NF2, NTHL1, PALB2, PMS2, POLD1, POLE, PTEN, RAD51C, RAD51D, RECQL, RET, SDHA, SDHAF2, SDHB, SDHC, SDHD, SMAD4, SMARCA4, STK11, TP53, TSC1, TSC2, and VHL.  RNA data is routinely analyzed for use in variant interpretation for all genes.   11/04/2021 Cancer Staging   Staging form: Breast, AJCC 8th Edition - Pathologic: Stage IA (pT1c, pN0,  cM0, G2, ER+, PR+, HER2-, Oncotype DX score: 7) - Signed by Sheila Samples, NP on 11/04/2021 Multigene prognostic tests performed: Oncotype  DX Recurrence score range: Less than 11 Histologic grading system: 3 grade system   11/04/2021 Survivorship   SCP delivered by Santiago Glad, NP      CURRENT THERAPY: Surveillance   INTERVAL HISTORY [name] returns for follow up as scheduled. Last seen by me 08/27/22.   ROS   Past Medical History:  Diagnosis Date   Cancer (HCC) 04/15/2021   left breast   Family history of breast cancer 09/05/2021   Genetic testing 09/15/2021   Negative hereditary cancer genetic testing: no pathogenic variants detected in [facility] Bryant +RNAinsight Panel.  Report date is September 14, 2021.   The Bryant+RNAinsight panel offered by [employer] includes sequencing and rearrangement analysis for the following 47 genes:  APC, ATM, AXIN2, BARD1, BMPR1A, BRCA1, BRCA2, BRIP1, CDH1, CDK4, CDKN2A, CHEK2, DICER1, EPCAM, GREM1, H   Hypothyroidism    Personal history of radiation therapy    PONV (postoperative nausea and vomiting)      Past Surgical History:  Procedure Laterality Date   BREAST LUMPECTOMY WITH SENTINEL LYMPH NODE BIOPSY Left 05/08/2021   Procedure: LEFT BREAST LUMPECTOMY WITH SENTINEL LYMPH NODE BX;  Surgeon: Sheila Lint, MD;  Location: [facility] OR;  Service: General;  Laterality: Left;   CESAREAN SECTION     x2 (1987 and 1990)   EYE SURGERY Right 2008   clogged tear duct   WISDOM TOOTH EXTRACTION  1977     Outpatient Encounter Medications as of 02/25/2023  Medication Sig   cetirizine (ZYRTEC) 10 MG tablet Take 10 mg by mouth daily.   fluticasone (FLONASE) 50 MCG/ACT nasal spray Place 1 spray into both nostrils daily.   gabapentin (NEURONTIN) 100 MG capsule TAKE 2 CAPSULES(200 MG) BY MOUTH AT BEDTIME   meclizine (ANTIVERT) 25 MG tablet Take 25 mg by mouth 3 (three) times daily as needed for dizziness.   SYNTHROID 75 MCG tablet Take 75 mcg by mouth daily before breakfast.   No facility-administered encounter medications on file as of 02/25/2023.     There were no vitals filed  for this visit. There is no height or weight on file to calculate BMI.   PHYSICAL EXAM GENERAL:alert, no distress and comfortable SKIN: no rash  EYES: sclera clear NECK: without mass LYMPH:  no palpable cervical or supraclavicular lymphadenopathy  LUNGS: clear with normal breathing effort HEART: regular rate & rhythm, no lower extremity edema ABDOMEN: abdomen soft, non-tender and normal bowel sounds NEURO: alert & oriented x 3 with fluent speech, no focal motor/sensory deficits Breast exam:  PAC without erythema    CBC    Component Value Date/Time   WBC 3.3 (L) 08/27/2022 0830   WBC 6.1 05/01/2021 1539   RBC 4.84 08/27/2022 0830   HGB 14.6 08/27/2022 0830   HCT 42.0 08/27/2022 0830   PLT 216 08/27/2022 0830   MCV 86.8 08/27/2022 0830   MCH 30.2 08/27/2022 0830   MCHC 34.8 08/27/2022 0830   RDW 12.7 08/27/2022 0830   LYMPHSABS 1.2 08/27/2022 0830   MONOABS 0.4 08/27/2022 0830   EOSABS 0.0 08/27/2022 0830   BASOSABS 0.0 08/27/2022 0830     CMP     Component Value Date/Time   NA 140 08/27/2022 0830   K 4.0 08/27/2022 0830   CL 106 08/27/2022 0830   CO2 28 08/27/2022 0830   GLUCOSE 83 08/27/2022 0830   BUN 12  08/27/2022 0830   CREATININE 0.69 08/27/2022 0830   CALCIUM 9.1 08/27/2022 0830   PROT 7.3 08/27/2022 0830   ALBUMIN 4.3 08/27/2022 0830   AST 37 08/27/2022 0830   ALT 50 (H) 08/27/2022 0830   ALKPHOS 110 08/27/2022 0830   BILITOT 0.6 08/27/2022 0830   GFRNONAA >60 08/27/2022 0830     ASSESSMENT & PLAN:Sheila Bryant is a 67 yo post menopausal female      1. 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 S/p left lumpectomy on 05/08/21 and adjuvant radiation Mitzi Hansen) 06/23/21 - 07/22/21 -Oncotype Rs was 7, low risk, no adjuvant chemo -She tried 3 months of AI, stopped due to hot flash, joint pain, and osteopenia. She feels much better off AI but still has hot  flashes at night. Declined Tamoxifen  -She prefers to stay off antiestrogen therapy and continue healthy active lifestyle for risk reduction -due mammo 03/2023   2. Hot flashes -previously managed with HRT -severe on AI, improved after she stopped exemestane -She still has occasional daytime hot flash and worse at night.   -Well-managed with gabapentin 100 mg nightly   3.  Osteopenia -new baseline DEXA on 11/13/21 showed osteopenia, with T-score -2.1 at right femur neck -she is taking calcium/vit D combo twice a week. Previously encouraged her to take vit D on the other 5 days -I reviewed tamoxifen can improve bone density. She continues to decline   4. Genetics  -she has family history of breast cancer in her mother and maternal aunt -genetic testing on 09/04/21 was negative.           PLAN:  No orders of the defined types were placed in this encounter.     All questions were answered. The patient knows to call the clinic with any problems, questions or concerns. No barriers to learning were detected. I spent *** counseling the patient face to face. The total time spent in the appointment was *** and more than 50% was on counseling, review of test results, and coordination of care.   Santiago Glad, NP-C @DATE @

## 2023-02-25 ENCOUNTER — Encounter: Payer: Self-pay | Admitting: Nurse Practitioner

## 2023-02-25 ENCOUNTER — Inpatient Hospital Stay: Payer: PPO | Admitting: Nurse Practitioner

## 2023-02-25 ENCOUNTER — Inpatient Hospital Stay: Payer: PPO | Attending: Nurse Practitioner

## 2023-02-25 VITALS — BP 130/64 | HR 55 | Temp 97.3°F | Resp 17 | Wt 136.9 lb

## 2023-02-25 DIAGNOSIS — Z923 Personal history of irradiation: Secondary | ICD-10-CM | POA: Diagnosis not present

## 2023-02-25 DIAGNOSIS — Z803 Family history of malignant neoplasm of breast: Secondary | ICD-10-CM | POA: Diagnosis not present

## 2023-02-25 DIAGNOSIS — Z170 Estrogen receptor positive status [ER+]: Secondary | ICD-10-CM

## 2023-02-25 DIAGNOSIS — Z853 Personal history of malignant neoplasm of breast: Secondary | ICD-10-CM | POA: Insufficient documentation

## 2023-02-25 DIAGNOSIS — C50012 Malignant neoplasm of nipple and areola, left female breast: Secondary | ICD-10-CM | POA: Diagnosis not present

## 2023-02-25 DIAGNOSIS — M85851 Other specified disorders of bone density and structure, right thigh: Secondary | ICD-10-CM | POA: Diagnosis not present

## 2023-02-25 DIAGNOSIS — R232 Flushing: Secondary | ICD-10-CM | POA: Diagnosis not present

## 2023-02-25 LAB — CBC WITH DIFFERENTIAL (CANCER CENTER ONLY)
Abs Immature Granulocytes: 0.01 10*3/uL (ref 0.00–0.07)
Basophils Absolute: 0 10*3/uL (ref 0.0–0.1)
Basophils Relative: 1 %
Eosinophils Absolute: 0.1 10*3/uL (ref 0.0–0.5)
Eosinophils Relative: 2 %
HCT: 42 % (ref 36.0–46.0)
Hemoglobin: 13.9 g/dL (ref 12.0–15.0)
Immature Granulocytes: 0 %
Lymphocytes Relative: 38 %
Lymphs Abs: 1.4 10*3/uL (ref 0.7–4.0)
MCH: 29.4 pg (ref 26.0–34.0)
MCHC: 33.1 g/dL (ref 30.0–36.0)
MCV: 89 fL (ref 80.0–100.0)
Monocytes Absolute: 0.4 10*3/uL (ref 0.1–1.0)
Monocytes Relative: 10 %
Neutro Abs: 1.8 10*3/uL (ref 1.7–7.7)
Neutrophils Relative %: 49 %
Platelet Count: 219 10*3/uL (ref 150–400)
RBC: 4.72 MIL/uL (ref 3.87–5.11)
RDW: 12.6 % (ref 11.5–15.5)
WBC Count: 3.6 10*3/uL — ABNORMAL LOW (ref 4.0–10.5)
nRBC: 0 % (ref 0.0–0.2)

## 2023-02-25 LAB — CMP (CANCER CENTER ONLY)
ALT: 32 U/L (ref 0–44)
AST: 25 U/L (ref 15–41)
Albumin: 4.2 g/dL (ref 3.5–5.0)
Alkaline Phosphatase: 118 U/L (ref 38–126)
Anion gap: 4 — ABNORMAL LOW (ref 5–15)
BUN: 19 mg/dL (ref 8–23)
CO2: 32 mmol/L (ref 22–32)
Calcium: 9.3 mg/dL (ref 8.9–10.3)
Chloride: 107 mmol/L (ref 98–111)
Creatinine: 0.69 mg/dL (ref 0.44–1.00)
GFR, Estimated: >60 mL/min (ref >60)
Glucose, Bld: 83 mg/dL (ref 70–99)
Potassium: 4.7 mmol/L (ref 3.5–5.1)
Sodium: 143 mmol/L (ref 135–145)
Total Bilirubin: 0.6 mg/dL (ref 0.3–1.2)
Total Protein: 7.2 g/dL (ref 6.5–8.1)

## 2023-04-21 ENCOUNTER — Ambulatory Visit
Admission: RE | Admit: 2023-04-21 | Discharge: 2023-04-21 | Disposition: A | Discharge status: Home / Self Care | Payer: PPO | Source: Ambulatory Visit | Attending: Nurse Practitioner | Admitting: Nurse Practitioner

## 2023-04-21 DIAGNOSIS — C50012 Malignant neoplasm of nipple and areola, left female breast: Secondary | ICD-10-CM

## 2023-09-13 ENCOUNTER — Other Ambulatory Visit: Payer: Self-pay

## 2023-09-13 ENCOUNTER — Other Ambulatory Visit: Payer: Self-pay | Admitting: Nurse Practitioner

## 2023-09-13 DIAGNOSIS — Z170 Estrogen receptor positive status [ER+]: Secondary | ICD-10-CM

## 2023-09-13 NOTE — Progress Notes (Unsigned)
 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 Suspicious palpable 0.9 cm mass in the 3 o'clock retroareolar region of the LEFT breast for which biopsy is recommended. 2. LEFT axilla is negative.   04/15/2021 Pathology Results   Diagnosis Breast, left, needle core biopsy, 3 o'clock retroareolar - INVASIVE DUCTAL CARCINOMA - SEE COMMENT Microscopic Comment Based on the biopsy, the carcinoma appears Nottingham grade 2-3 of 3 and measures 0.7 cm in greatest linear extent.  PROGNOSTIC INDICATORS Results: The tumor cells are EQUIVOCAL for Her2 (2+). Her2 by FISH will be performed and results reported separately. 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 BREAST, LEFT, LUMPECTOMY:  - Invasive ductal carcinoma, 1.3 cm, grade 2  - Ductal carcinoma in situ, intermediate grade  - Resection margins are negative for carcinoma - closest is the anterior margin at 0.2 cm  - Biopsy site changes  - See oncology table   B. LYMPH NODE, LEFT AXILLARY, SENTINEL, EXCISION:  - Lymph node, negative for carcinoma (0/1)    05/08/2021 Miscellaneous   Oncotype DX recurrence score of 7 predicts a risk of recurrence outside the breast over the next 9 years of 3%.    06/24/2021 - 07/22/2021 Radiation Therapy   Site Technique Total Dose (Gy) Dose per Fx (Gy) Completed Fx Beam Energies  Breast, Left: Breast_L 3D 42.56/42.56 2.66 16/16 6X, 10X  Breast, Left: Breast_L_Bst 3D 8/8 2 4/4 10X     07/2021 -  Anti-estrogen oral therapy   Began adjuvant exemestane    09/15/2021 Genetic Testing   Negative hereditary cancer genetic testing: no pathogenic variants detected in [facility]RNAinsight Panel.  Report date is September 14, 2021.   The [facility]+RNAinsight panel offered by [employer] includes sequencing and rearrangement analysis for the following 47 genes:  APC, ATM, AXIN2, BARD1, BMPR1A, BRCA1, BRCA2, BRIP1, CDH1, CDK4, CDKN2A, CHEK2, DICER1, EPCAM, GREM1, HOXB13, MEN1, MLH1, MSH2, MSH3, MSH6, MUTYH, NBN, NF1, NF2, NTHL1, PALB2, PMS2, POLD1, POLE, PTEN, RAD51C, RAD51D, RECQL, RET, SDHA, SDHAF2, SDHB, SDHC, SDHD, SMAD4, SMARCA4, STK11, TP53, TSC1, TSC2, and VHL.  RNA data is routinely analyzed for use in variant interpretation for all genes.   11/04/2021 Cancer Staging   Staging form: Breast, AJCC 8th Edition - Pathologic: Stage IA (pT1c, pN0,  cM0, G2, ER+, PR+, HER2-, Oncotype DX score: 7) - Signed by Pollyann Samples, NP on 11/04/2021 Multigene prognostic tests performed: Oncotype  DX Recurrence score range: Less than 11 Histologic grading system: 3 grade system   11/04/2021 Survivorship   SCP delivered by Santiago Glad, NP      CURRENT THERAPY: Surveillance   INTERVAL HISTORY Ms. Mcintire returns for follow up as scheduled. Last seen by me 6 months ago.   ROS   Past Medical History:  Diagnosis Date   Cancer (HCC) 04/15/2021   left breast   Family history of breast cancer 09/05/2021   Genetic testing 09/15/2021   Negative hereditary cancer genetic testing: no pathogenic variants detected in [facility] [facility]RNAinsight Panel.  Report date is September 14, 2021.   The [employer]+RNAinsight panel offered by [employer] includes sequencing and rearrangement analysis for the following 47 genes:  APC, ATM, AXIN2, BARD1, BMPR1A, BRCA1, BRCA2, BRIP1, CDH1, CDK4, CDKN2A, CHEK2, DICER1, EPCAM, GREM1, H   Hypothyroidism    Personal history of radiation therapy    PONV (postoperative nausea and vomiting)      Past Surgical History:  Procedure Laterality Date   BREAST LUMPECTOMY Left 05/08/2021   BREAST LUMPECTOMY WITH SENTINEL LYMPH NODE BIOPSY Left 05/08/2021   Procedure: LEFT BREAST LUMPECTOMY WITH SENTINEL LYMPH NODE BX;  Surgeon: Almond Lint, MD;  Location: [facility] OR;  Service: General;  Laterality: Left;   CESAREAN SECTION     x2 (1987 and 1990)   EYE SURGERY Right 2008   clogged tear duct   WISDOM TOOTH EXTRACTION  1977     Outpatient Encounter Medications as of 09/14/2023  Medication Sig   cetirizine (ZYRTEC) 10 MG tablet Take 10 mg by mouth daily.   fluticasone (FLONASE) 50 MCG/ACT nasal spray Place 1 spray into both nostrils daily.   gabapentin (NEURONTIN) 100 MG capsule TAKE 2 CAPSULES(200 MG) BY MOUTH AT BEDTIME   meclizine (ANTIVERT) 25 MG tablet Take 25 mg by mouth 3 (three) times daily as needed for dizziness.   SYNTHROID 75 MCG tablet Take 75 mcg by mouth daily before breakfast.   No facility-administered encounter medications on file as of  09/14/2023.     There were no vitals filed for this visit. There is no height or weight on file to calculate BMI.   PHYSICAL EXAM GENERAL:alert, no distress and comfortable SKIN: no rash  EYES: sclera clear NECK: without mass LYMPH:  no palpable cervical or supraclavicular lymphadenopathy  LUNGS: clear with normal breathing effort HEART: regular rate & rhythm, no lower extremity edema ABDOMEN: abdomen soft, non-tender and normal bowel sounds NEURO: alert & oriented x 3 with fluent speech, no focal motor/sensory deficits Breast exam:  PAC without erythema    CBC    Component Value Date/Time   WBC 3.6 (L) 02/25/2023 0827   WBC 6.1 05/01/2021 1539   RBC 4.72 02/25/2023 0827   HGB 13.9 02/25/2023 0827   HCT 42.0 02/25/2023 0827   PLT 219 02/25/2023 0827   MCV 89.0 02/25/2023 0827   MCH 29.4 02/25/2023 0827   MCHC 33.1 02/25/2023 0827   RDW 12.6 02/25/2023 0827   LYMPHSABS 1.4 02/25/2023 0827   MONOABS 0.4 02/25/2023 0827   EOSABS 0.1 02/25/2023 0827   BASOSABS 0.0 02/25/2023 0827     CMP     Component Value Date/Time   NA 143 02/25/2023 0827   K 4.7 02/25/2023 0827   CL 107 02/25/2023 0827   CO2 32 02/25/2023 0827  GLUCOSE 83 02/25/2023 0827   BUN 19 02/25/2023 0827   CREATININE 0.69 02/25/2023 0827   CALCIUM 9.3 02/25/2023 0827   PROT 7.2 02/25/2023 0827   ALBUMIN 4.2 02/25/2023 0827   AST 25 02/25/2023 0827   ALT 32 02/25/2023 0827   ALKPHOS 118 02/25/2023 0827   BILITOT 0.6 02/25/2023 0827   GFRNONAA >60 02/25/2023 0827     ASSESSMENT & PLAN:Trish C Tyson is a 68 yo post menopausal female      1. 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 S/p left lumpectomy on 05/08/21 and adjuvant radiation Mitzi Hansen) 06/23/21 - 07/22/21 -Oncotype Rs was 7, low risk, no adjuvant chemo -She tried 3 months of AI, stopped due to hot flash, joint pain, and osteopenia. She  feels much better off AI but still has hot flashes at night.  Again declined alternative AI or tamoxifen  -She continues healthy active lifestyle for risk reduction -Mammo 03/2023 was benign   2. Hot flashes -previously managed with HRT -severe on AI, improved after she stopped exemestane -She still has occasional daytime hot flash and worse at night.   -Partially managed with gabapentin 100 mg nightly as needed, I encouraged her to be consistent and can increase dose if needed   3.  Osteopenia -new baseline DEXA on 11/13/21 showed osteopenia, with T-score -2.1 at right femur neck -she is taking calcium/vit D combo twice a week. Previously encouraged her to take vit D on the other 5 days   4. Genetics  -she has family history of breast cancer in her mother and maternal aunt -genetic testing on 09/04/21 was negative.         PLAN:  No orders of the defined types were placed in this encounter.     All questions were answered. The patient knows to call the clinic with any problems, questions or concerns. No barriers to learning were detected. I spent *** counseling the patient face to face. The total time spent in the appointment was *** and more than 50% was on counseling, review of test results, and coordination of care.   Santiago Glad, NP-C @DATE @

## 2023-09-14 ENCOUNTER — Inpatient Hospital Stay (HOSPITAL_BASED_OUTPATIENT_CLINIC_OR_DEPARTMENT_OTHER): Payer: PPO | Admitting: Nurse Practitioner

## 2023-09-14 ENCOUNTER — Inpatient Hospital Stay: Payer: PPO | Attending: Nurse Practitioner

## 2023-09-14 ENCOUNTER — Encounter: Payer: Self-pay | Admitting: Nurse Practitioner

## 2023-09-14 VITALS — BP 142/72 | HR 60 | Temp 97.4°F | Resp 19 | Wt 137.7 lb

## 2023-09-14 DIAGNOSIS — Z170 Estrogen receptor positive status [ER+]: Secondary | ICD-10-CM | POA: Diagnosis not present

## 2023-09-14 DIAGNOSIS — Z803 Family history of malignant neoplasm of breast: Secondary | ICD-10-CM | POA: Diagnosis not present

## 2023-09-14 DIAGNOSIS — Z923 Personal history of irradiation: Secondary | ICD-10-CM | POA: Diagnosis not present

## 2023-09-14 DIAGNOSIS — C50012 Malignant neoplasm of nipple and areola, left female breast: Secondary | ICD-10-CM | POA: Diagnosis not present

## 2023-09-14 DIAGNOSIS — M85851 Other specified disorders of bone density and structure, right thigh: Secondary | ICD-10-CM | POA: Diagnosis not present

## 2023-09-14 DIAGNOSIS — R7401 Elevation of levels of liver transaminase levels: Secondary | ICD-10-CM | POA: Diagnosis not present

## 2023-09-14 DIAGNOSIS — Z853 Personal history of malignant neoplasm of breast: Secondary | ICD-10-CM | POA: Diagnosis present

## 2023-09-14 DIAGNOSIS — R232 Flushing: Secondary | ICD-10-CM | POA: Insufficient documentation

## 2023-09-14 LAB — CBC WITH DIFFERENTIAL (CANCER CENTER ONLY)
Abs Immature Granulocytes: 0.01 10*3/uL (ref 0.00–0.07)
Basophils Absolute: 0 10*3/uL (ref 0.0–0.1)
Basophils Relative: 0 %
Eosinophils Absolute: 0.1 10*3/uL (ref 0.0–0.5)
Eosinophils Relative: 1 %
HCT: 44 % (ref 36.0–46.0)
Hemoglobin: 14.9 g/dL (ref 12.0–15.0)
Immature Granulocytes: 0 %
Lymphocytes Relative: 40 %
Lymphs Abs: 2 10*3/uL (ref 0.7–4.0)
MCH: 29.8 pg (ref 26.0–34.0)
MCHC: 33.9 g/dL (ref 30.0–36.0)
MCV: 88 fL (ref 80.0–100.0)
Monocytes Absolute: 0.5 10*3/uL (ref 0.1–1.0)
Monocytes Relative: 9 %
Neutro Abs: 2.5 10*3/uL (ref 1.7–7.7)
Neutrophils Relative %: 50 %
Platelet Count: 279 10*3/uL (ref 150–400)
RBC: 5 MIL/uL (ref 3.87–5.11)
RDW: 12.7 % (ref 11.5–15.5)
WBC Count: 5.1 10*3/uL (ref 4.0–10.5)
nRBC: 0 % (ref 0.0–0.2)

## 2023-09-14 LAB — CMP (CANCER CENTER ONLY)
ALT: 77 U/L — ABNORMAL HIGH (ref 0–44)
AST: 27 U/L (ref 15–41)
Albumin: 4.2 g/dL (ref 3.5–5.0)
Alkaline Phosphatase: 135 U/L — ABNORMAL HIGH (ref 38–126)
Anion gap: 6 (ref 5–15)
BUN: 18 mg/dL (ref 8–23)
CO2: 30 mmol/L (ref 22–32)
Calcium: 9 mg/dL (ref 8.9–10.3)
Chloride: 103 mmol/L (ref 98–111)
Creatinine: 0.64 mg/dL (ref 0.44–1.00)
GFR, Estimated: >60 mL/min (ref >60)
Glucose, Bld: 79 mg/dL (ref 70–99)
Potassium: 3.6 mmol/L (ref 3.5–5.1)
Sodium: 139 mmol/L (ref 135–145)
Total Bilirubin: 0.4 mg/dL (ref 0.0–1.2)
Total Protein: 7.5 g/dL (ref 6.5–8.1)

## 2023-10-20 ENCOUNTER — Encounter: Payer: Self-pay | Admitting: Nurse Practitioner

## 2023-10-21 ENCOUNTER — Other Ambulatory Visit: Payer: Self-pay | Admitting: Nurse Practitioner

## 2023-10-21 MED ORDER — GABAPENTIN 100 MG PO CAPS: ORAL_CAPSULE | ORAL | 2 refills | Status: DC

## 2023-10-24 IMAGING — US US BREAST BX W LOC DEV 1ST LESION IMG BX SPEC US GUIDE*L*
1 series · 9 of 9 positions shown · non-contrast
Comparison: Previous exam(s).
COMPARISON: Previous exam(s).

Addendum:
CLINICAL DATA: 65-year-old female with a suspicious left breast
mass.

EXAM:
ULTRASOUND GUIDED LEFT BREAST CORE NEEDLE BIOPSY

[Series 1: us breast bx w loc dev 1st lesion img bx spec us g · 0.06mm/px · 9 of 9 slices shown]
[im 1/9]
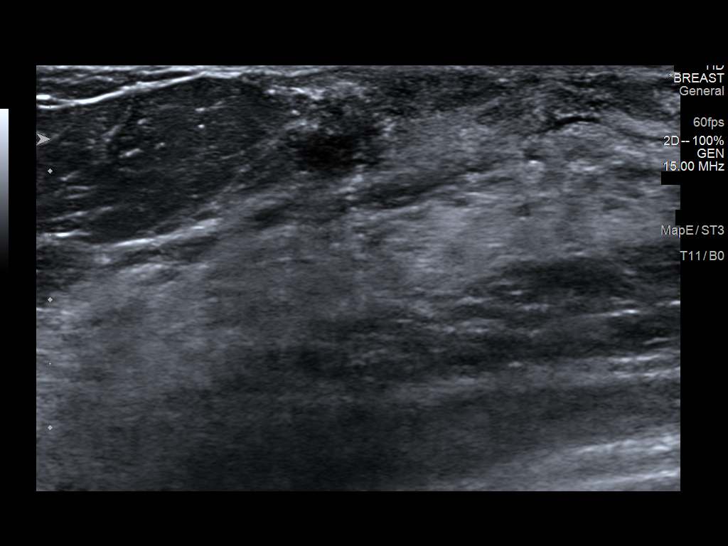
[im 2/9]
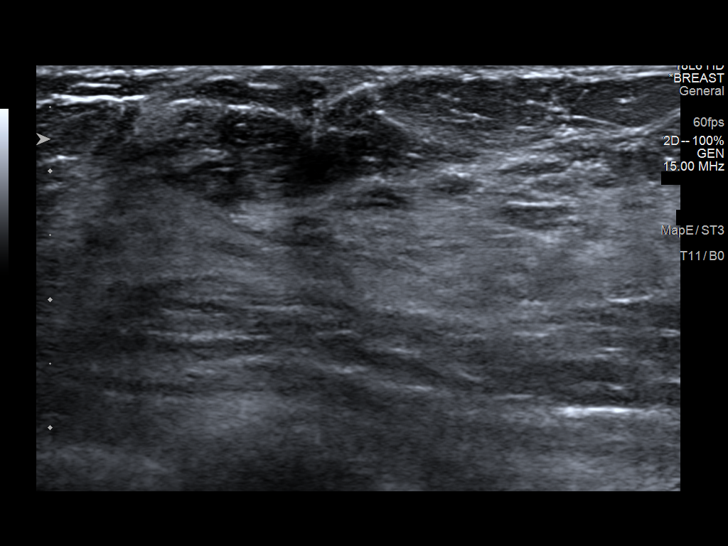
[im 3/9]
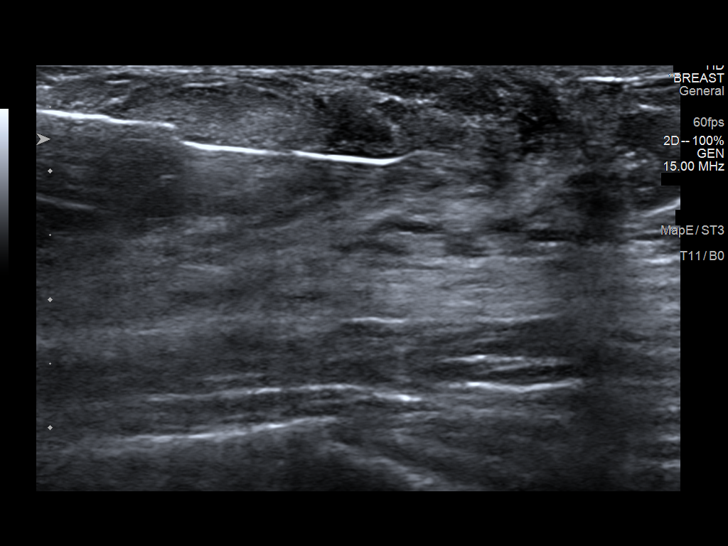
[im 4/9]
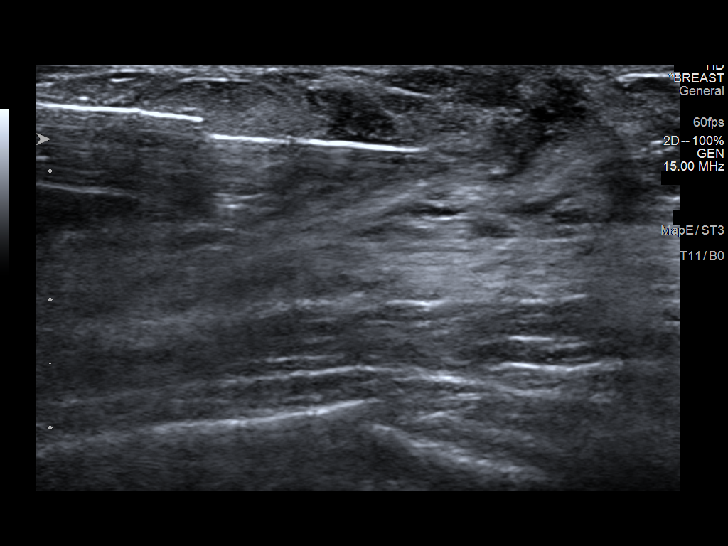
[im 5/9]
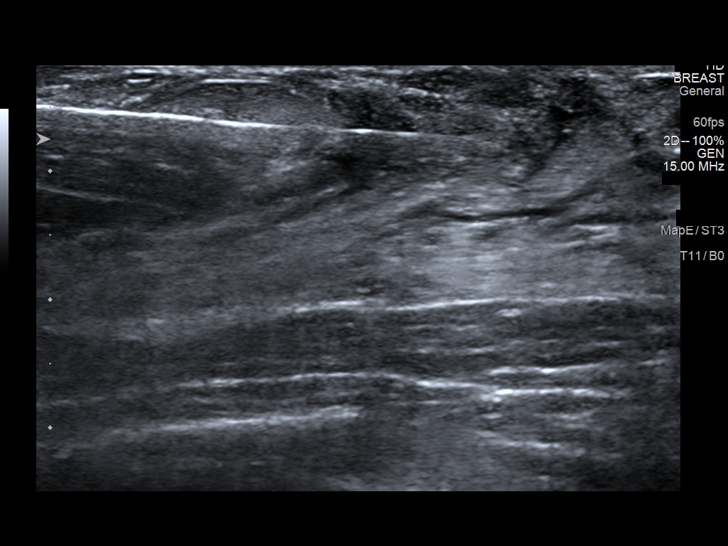
[im 6/9]
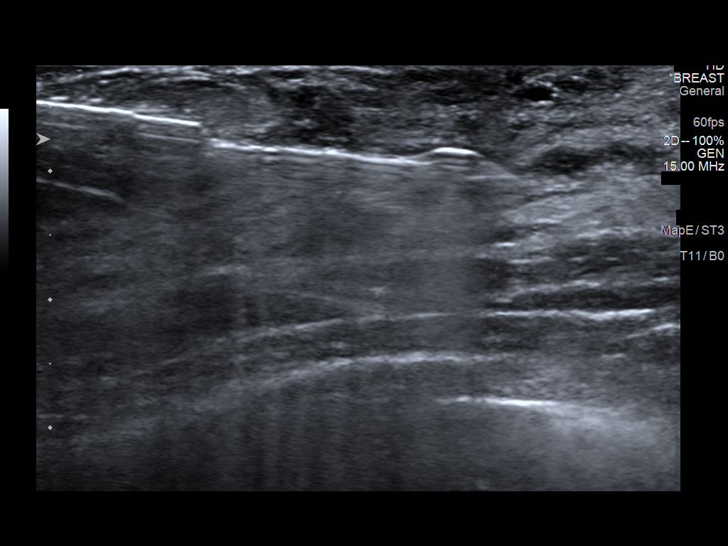
[im 7/9]
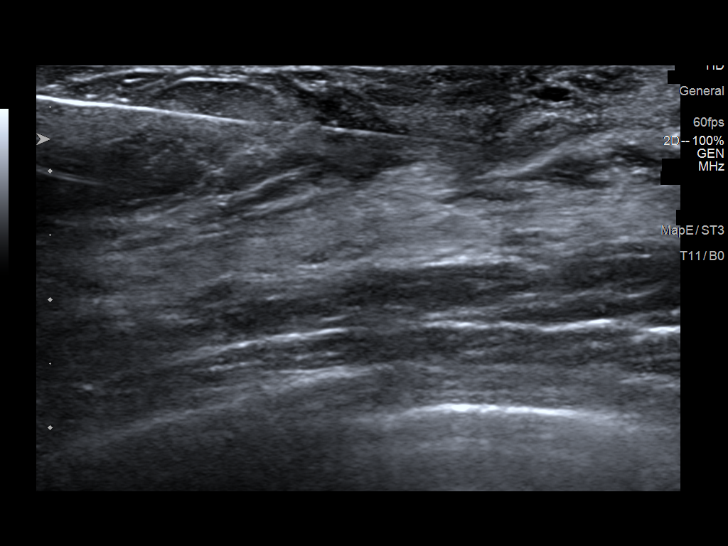
[im 8/9]
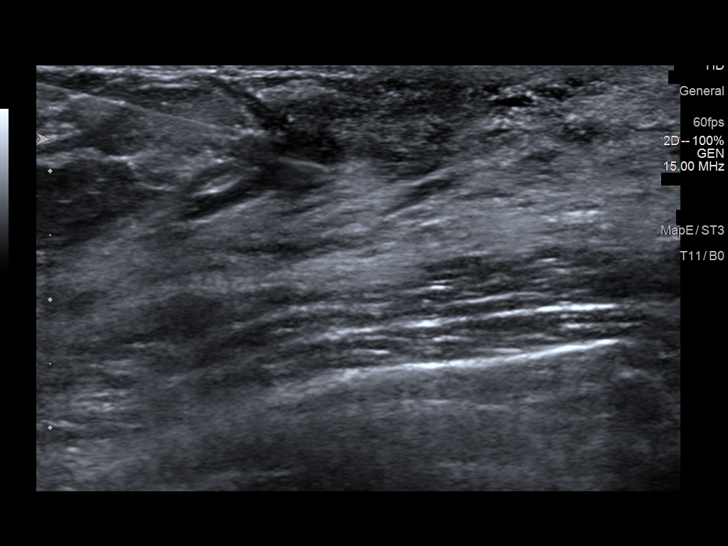
[im 9/9]
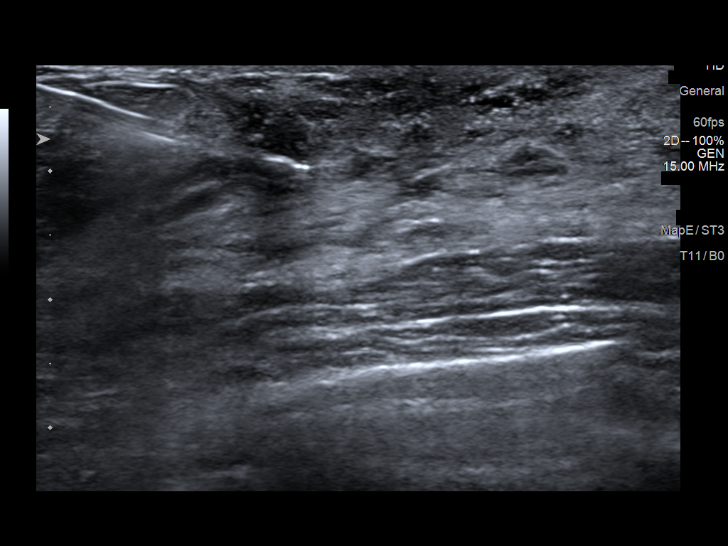

[9 of 9 positions shown; findings below may reference images not displayed]



Lesion quadrant: Upper outer quadrant

Using sterile technique and 1% Lidocaine as local anesthetic, under
direct ultrasound visualization, a 14 gauge Drigo device was
used to perform biopsy of a mass at the 3 o'clock retroareolar
position using a lateral approach. At the conclusion of the
procedure a ribbon shaped tissue marker clip was deployed into the
biopsy cavity. Follow up 2 view mammogram was performed and dictated
separately.
IMPRESSION: Ultrasound guided biopsy of the left breast. No apparent
complications.

ADDENDUM:
Pathology revealed GRADE II INVASIVE DUCTAL CARCINOMA of the LEFT
breast, 3 o'clock, retroareolar, (ribbon clip). This was found to be
concordant by Dr. Klever Jumper.

Pathology results were discussed with the patient by telephone. The
patient reported doing well after the biopsy with tenderness at the
site. Post biopsy instructions and care were reviewed and questions
were answered. The patient was encouraged to call The [REDACTED] for any additional concerns. My direct phone
number was provided.

Surgical consultation has been arranged with Dr. Harald Chae at
[[email or web address]] on April 28, 2021.

Pathology results reported by Shalley Jim, RN on 04/18/2021.



Lesion quadrant: Upper outer quadrant

Using sterile technique and 1% Lidocaine as local anesthetic, under
direct ultrasound visualization, a 14 gauge Drigo device was
used to perform biopsy of a mass at the 3 o'clock retroareolar
position using a lateral approach. At the conclusion of the
procedure a ribbon shaped tissue marker clip was deployed into the
biopsy cavity. Follow up 2 view mammogram was performed and dictated
separately.
IMPRESSION: Ultrasound guided biopsy of the left breast. No apparent
complications.

## 2023-10-24 IMAGING — MG MM BREAST LOCALIZATION CLIP
4 series · 4 of 12 positions shown · non-contrast
Comparison: Previous exam(s).

CLINICAL DATA: Status post left breast ultrasound-guided biopsy

EXAM:
3D DIAGNOSTIC LEFT MAMMOGRAM POST ULTRASOUND BIOPSY

[L ML synth-2D]
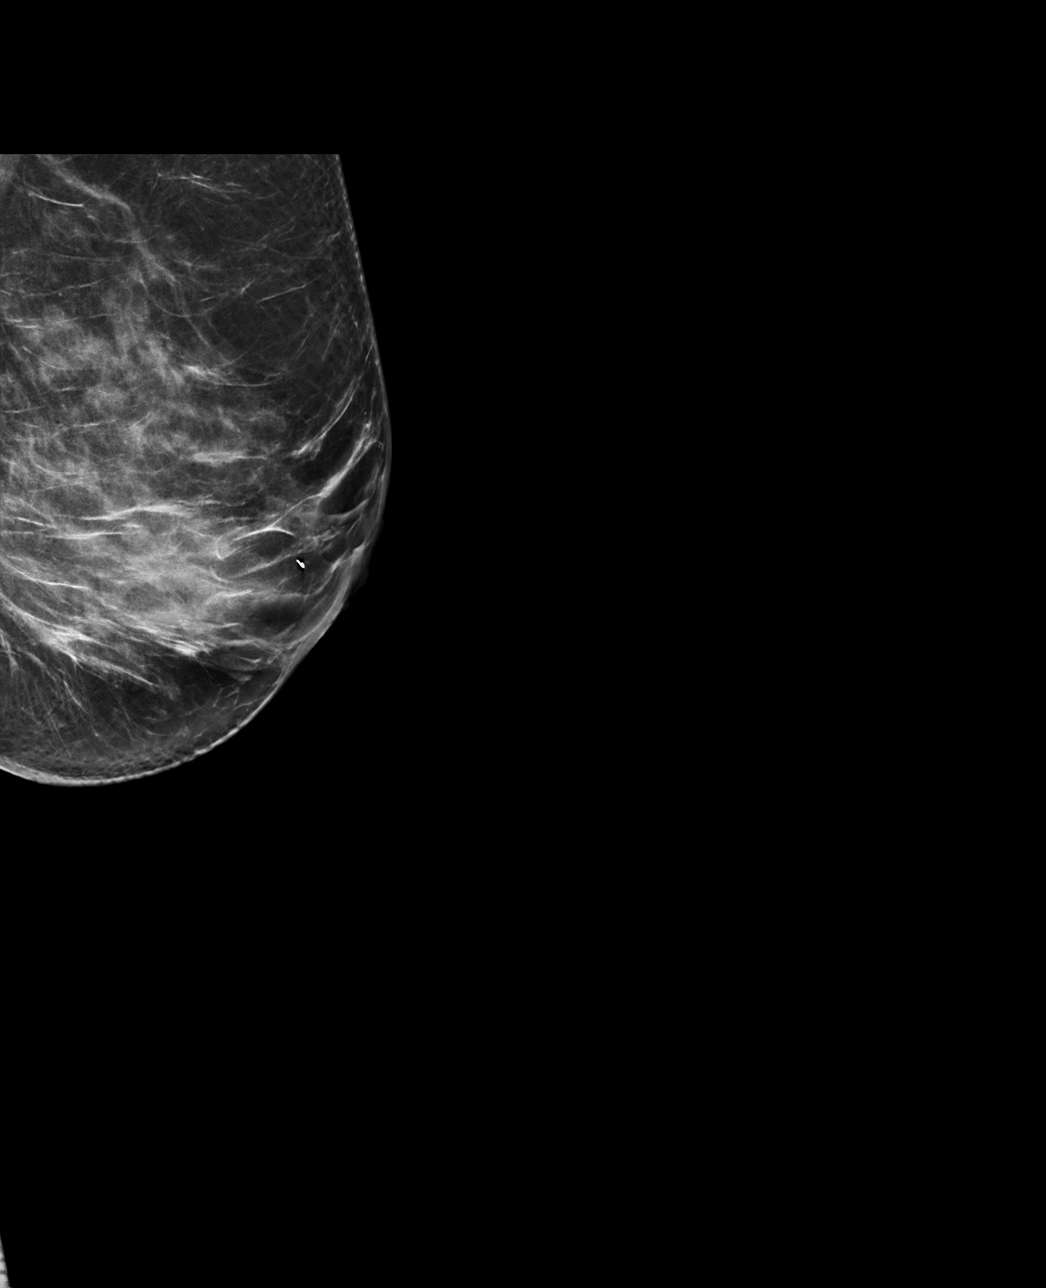

[L CC synth-2D]
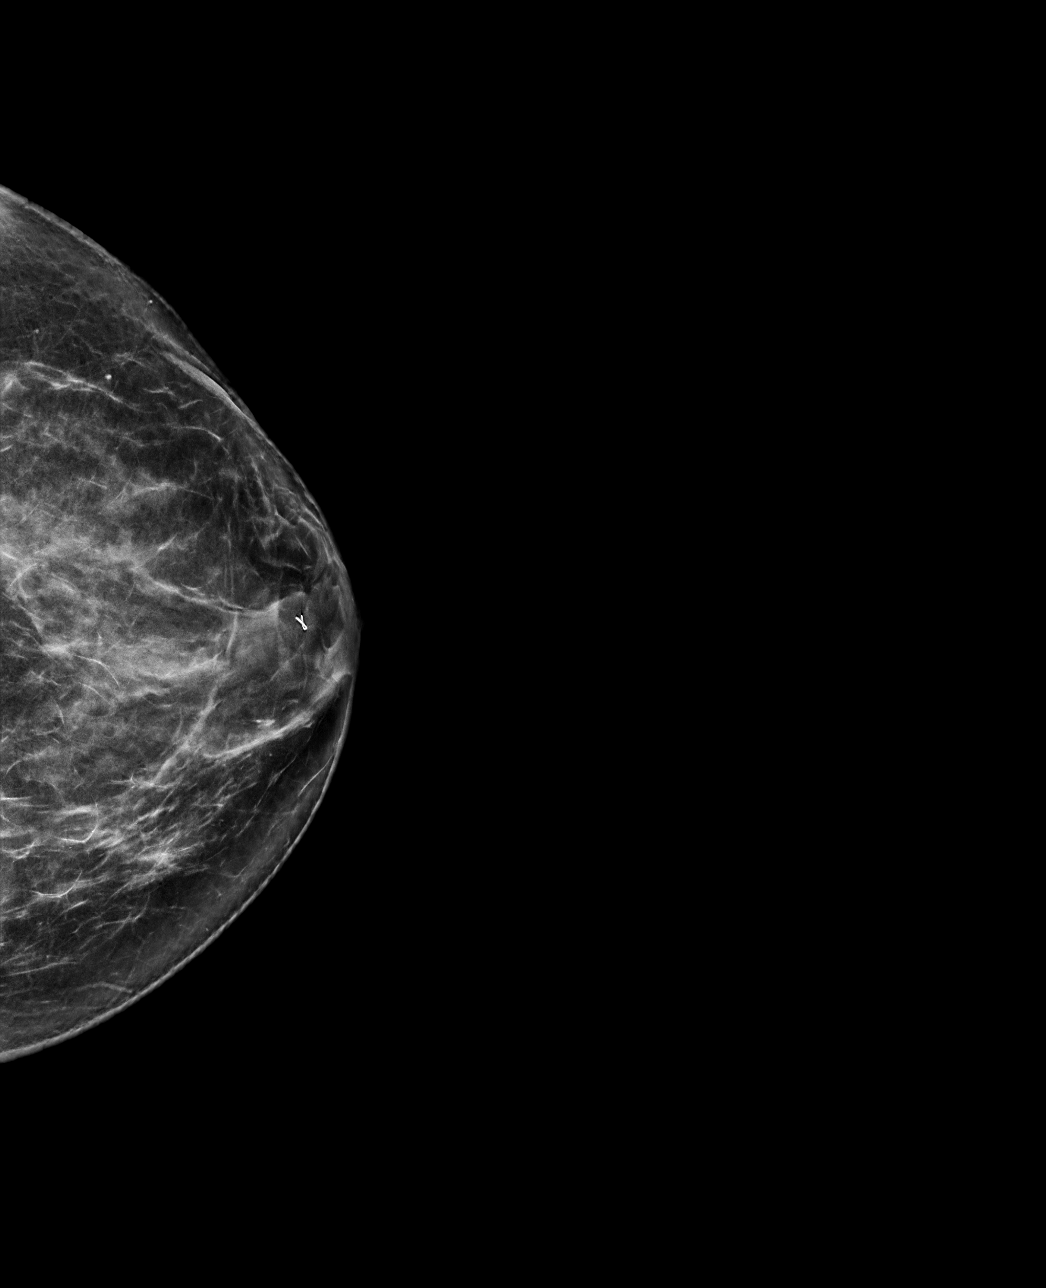

[L ML tomo · tomo slice 39/77.0]
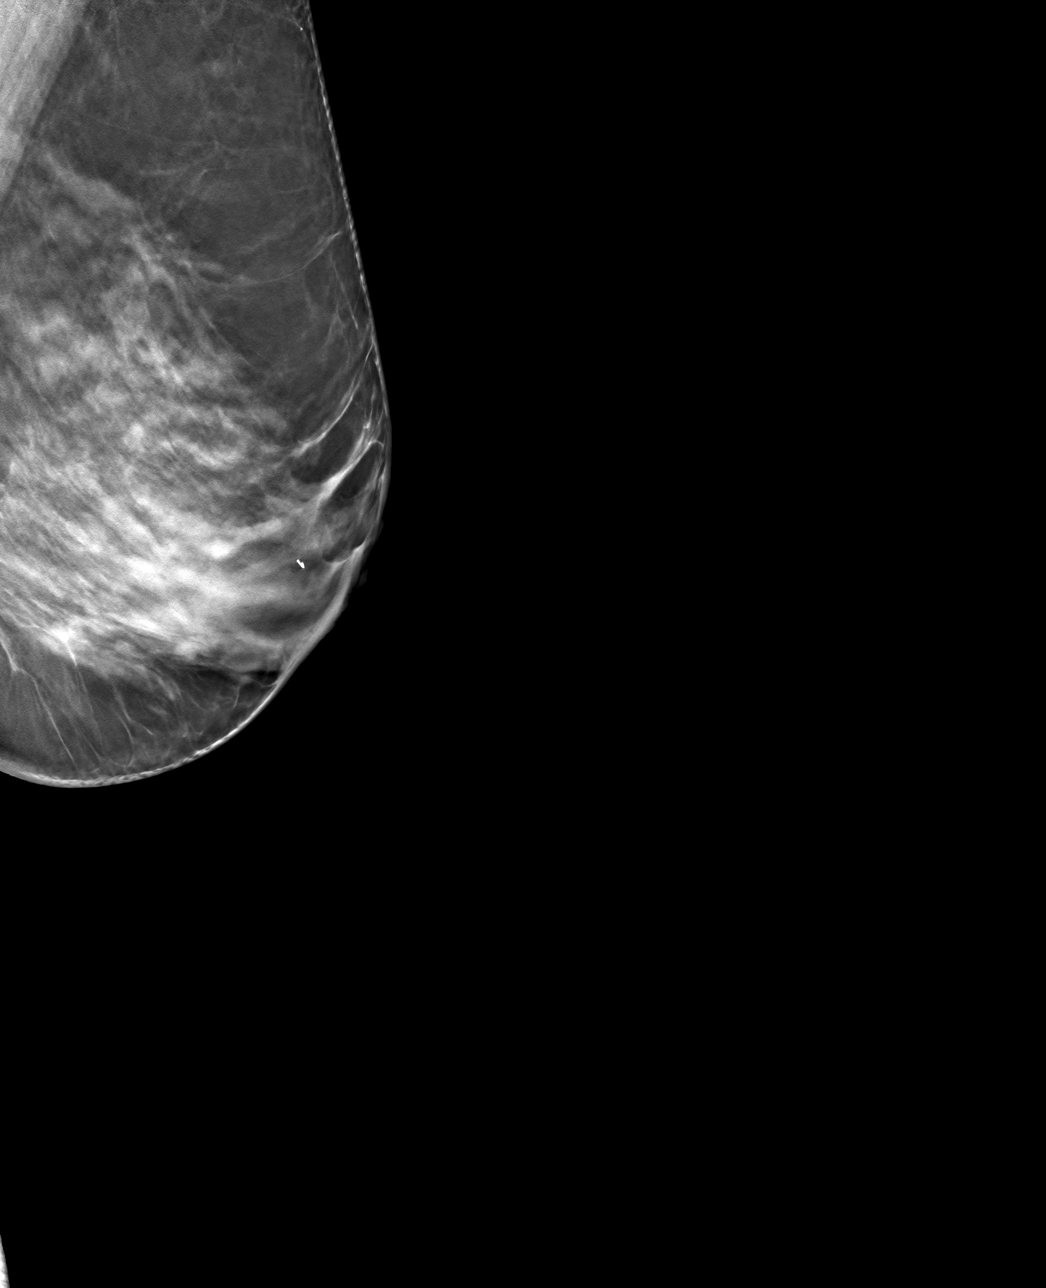

[L CC tomo · tomo slice 39/77.0]
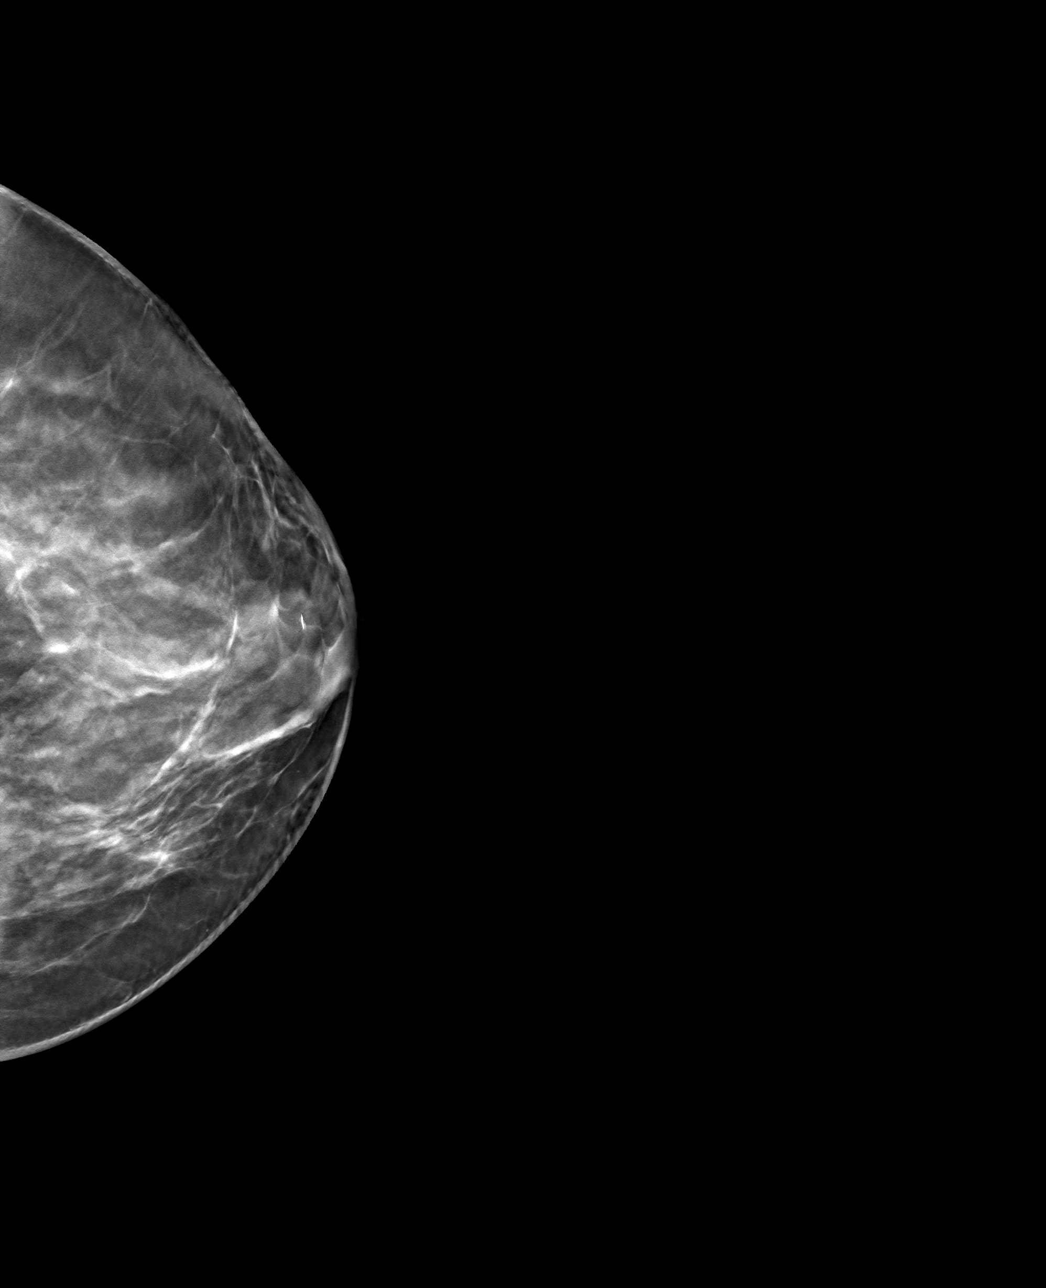

[4 of 12 positions shown; findings below may reference images not displayed]

FINDINGS: 3D Mammographic images were obtained following ultrasound guided
biopsy of the left breast. The biopsy marking clip is in expected
position at the site of biopsy.
IMPRESSION: Appropriate positioning of the ribbon shaped biopsy marking clip at
the site of biopsy in the retroareolar left breast.

Final Assessment: Post Procedure Mammograms for Marker Placement

## 2024-01-19 ENCOUNTER — Other Ambulatory Visit: Payer: Self-pay | Admitting: Nurse Practitioner

## 2024-02-07 ENCOUNTER — Other Ambulatory Visit (HOSPITAL_BASED_OUTPATIENT_CLINIC_OR_DEPARTMENT_OTHER)

## 2024-02-08 ENCOUNTER — Ambulatory Visit (HOSPITAL_BASED_OUTPATIENT_CLINIC_OR_DEPARTMENT_OTHER)
Admission: RE | Admit: 2024-02-08 | Discharge: 2024-02-08 | Disposition: A | Discharge status: Home / Self Care | Source: Ambulatory Visit | Attending: Nurse Practitioner | Admitting: Nurse Practitioner

## 2024-02-08 DIAGNOSIS — M8589 Other specified disorders of bone density and structure, multiple sites: Secondary | ICD-10-CM | POA: Insufficient documentation

## 2024-02-08 DIAGNOSIS — Z13820 Encounter for screening for osteoporosis: Secondary | ICD-10-CM | POA: Diagnosis present

## 2024-02-08 DIAGNOSIS — Z170 Estrogen receptor positive status [ER+]: Secondary | ICD-10-CM | POA: Insufficient documentation

## 2024-02-08 DIAGNOSIS — C50012 Malignant neoplasm of nipple and areola, left female breast: Secondary | ICD-10-CM | POA: Insufficient documentation

## 2024-03-15 ENCOUNTER — Other Ambulatory Visit: Payer: Self-pay

## 2024-03-15 DIAGNOSIS — Z170 Estrogen receptor positive status [ER+]: Secondary | ICD-10-CM

## 2024-03-15 NOTE — Assessment & Plan Note (Addendum)
 Stage IA, p(T1c, N0), ER+/PR+/HER2-, Grade 2, Oncotype RS 7 -presented with palpable left breast lump. S/p left lumpectomy on 05/08/21 (IDC and DCIS) and adjuvant radiation Sheila Bryant) 06/23/21 - 07/22/21 -Oncotype Rs was 7, low risk, no adjuvant chemo -She tried 3 months of AI, stopped due to hot flash, joint pain, and osteopenia. She feels much better off AI but still has hot flashes at night.  Again declined alternative AI or tamoxifen  -She continues healthy active lifestyle for risk reduction -Mammo 03/2023 was benign -DEXA scan from 02/03/2022 showed osteopenia without osteoporosis.  She takes calcium with vitamin D every day and is physically active. -Continue breast cancer surveillance.  Screening mammogram to be scheduled for late October 2025. -Follow-up in 6 months, or sooner if needed

## 2024-03-15 NOTE — Progress Notes (Signed)
 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 S/p left lumpectomy on 05/08/21 (IDC and DCIS) and adjuvant radiation Sheila Bryant) 06/23/21 - 07/22/21 -Oncotype Rs was 7, low risk, no adjuvant chemo -She tried 3 months of AI, stopped due to hot flash, joint pain, and osteopenia. She feels much better off AI but still has hot flashes at night.  Again declined alternative AI or tamoxifen  -She continues healthy active lifestyle for risk reduction -Mammo 03/2023 was benign -DEXA scan from 02/03/2022 showed osteopenia without osteoporosis.  She takes calcium with vitamin D every day and is physically active. -Continue breast cancer surveillance.  Screening mammogram to be scheduled for late October 2025. -Follow-up in 6 months, or sooner if needed   Leukopenia Mild leukopenia with WBC 28.  ANC 1.3.  She denies fevers, chills, night sweats, or unintentional weight loss.  CBC is overall stable.  Will continue to monitor closely.  Osteopenia DEXA scan done 02/04/2024 showed osteopenia without osteoporosis.  She takes calcium and vitamin D daily.  Continues to be very active.  Repeat in August 2027.  Plan Labs reviewed. -Mild leukopenia with CBC otherwise unremarkable. - CMP unremarkable with normal LFTs. Reviewed diagnostic  mammogram from 04/21/2023 which was benign.  Breast density category C.  Recommendation to repeat with screening mammogram in October 2025.  This will be ordered as part of today's visit. Reviewed DEXA scan from 02/04/2024 showing osteopenia.  Repeat in August 2027. Cancer surveillance. Plan for labs and follow-up in 6 months, sooner if needed.  The patient understands the plans discussed today and is in agreement with them.  She knows to contact our office if she develops concerns prior to her next appointment.  I provided 25 minutes of face-to-face time during this encounter and > 50% was spent counseling as documented under my assessment and plan.    Sheila FORBES Lessen, NP  [facility] - A DEPT OF Sheila DEL. [facility] [address] [place] [address] Dept: [phone] Dept Fax: [phone]   No orders of the defined types were placed in this encounter.     CHIEF COMPLAINT:  CC: Left breast cancer, ER +  Current Treatment: Surveillance  INTERVAL HISTORY:  Sheila Bryant is here today for repeat clinical assessment. She last saw Lacie, NP, on 09/14/2023.SABRA  Most recent diagnostic mammogram done on 04/20/2024.  She has breast density C with benign results overall.  Recommendation is for screening mammogram in October 2025.  She had DEXA scan in August 2025 which showed osteopenia.  She takes calcium and vitamin D every day and stays active.  She denies changes, masses, or lumps in either breast.  She denies chest pain, chest pressure, or shortness of breath. She denies headaches or visual disturbances. She denies abdominal pain, nausea, vomiting, or changes in bowel or bladder habits.  She denies  fevers or chills. She denies pain. Her appetite is good. Her weight has been stable.  I have reviewed the past medical history, past surgical history, social history and family history with the patient and they are unchanged from previous  note.  ALLERGIES:  is allergic to tape.  MEDICATIONS:  Current Outpatient Medications  Medication Sig Dispense Refill   Calcium Carb-Cholecalciferol (CALCIUM-VITAMIN D) 600-20 MG-MCG TABS Take 1 tablet by mouth at bedtime.     cetirizine (ZYRTEC) 10 MG tablet Take 10 mg by mouth daily.     fluticasone (FLONASE) 50 MCG/ACT nasal spray Place 1 spray into both nostrils daily.     gabapentin  (NEURONTIN ) 100 MG capsule TAKE 2 CAPSULES(200 MG) BY MOUTH AT BEDTIME 60 capsule 2   hydrochlorothiazide (HYDRODIURIL) 25 MG tablet Take 25 mg by mouth daily. Takes 12.5mg  (1/2 tab) daily     MAGNESIUM PO Take 2 tablets by mouth at bedtime.     meclizine (ANTIVERT) 25 MG tablet Take 25 mg by mouth 3 (three) times daily as needed for dizziness.     SYNTHROID 75 MCG tablet Take 75 mcg by mouth daily before breakfast. 75mcg 5 days 50mcg 2 days     No current facility-administered medications for this visit.    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 Suspicious palpable 0.9 cm mass in the 3 o'clock retroareolar region of the LEFT breast for which biopsy is recommended. 2. LEFT axilla is negative.   04/15/2021 Pathology Results   Diagnosis Breast, left, needle core biopsy, 3 o'clock retroareolar - INVASIVE DUCTAL CARCINOMA - SEE COMMENT Microscopic Comment Based on the biopsy, the carcinoma appears Nottingham grade 2-3 of 3 and measures 0.7 cm in greatest linear  extent.  PROGNOSTIC INDICATORS Results: The tumor cells are EQUIVOCAL for Her2 (2+). Her2 by FISH will be performed and results reported separately. 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 BREAST, LEFT, LUMPECTOMY:  - Invasive ductal carcinoma, 1.3 cm, grade 2  - Ductal carcinoma in situ, intermediate grade  - Resection margins are negative for carcinoma - closest is the anterior margin at 0.2 cm  - Biopsy site changes  - See oncology table   B. LYMPH NODE, LEFT AXILLARY, SENTINEL, EXCISION:  - Lymph node, negative for carcinoma (0/1)    05/08/2021 Miscellaneous   Oncotype DX recurrence score of 7 predicts a risk of recurrence outside the breast over the next 9 years of 3%.    06/24/2021 - 07/22/2021 Radiation Therapy   Site Technique Total Dose (Gy) Dose per Fx (Gy) Completed Fx Beam Energies  Breast, Left: Breast_L 3D 42.56/42.56 2.66 16/16 6X, 10X  Breast, Left: Breast_L_Bst 3D 8/8 2 4/4 10X     07/2021 -  Anti-estrogen oral therapy   Began adjuvant exemestane     09/15/2021 Genetic Testing   Negative hereditary cancer genetic testing: no pathogenic variants detected in [facility]Next-Cancer +RNAinsight Panel.  Report date is September 14, 2021.   The CustomNext-Cancer+RNAinsight panel offered by [employer] [facility] includes sequencing and rearrangement analysis for the following 47 genes:  APC, ATM, AXIN2, BARD1, BMPR1A, BRCA1, BRCA2, BRIP1, CDH1, CDK4, CDKN2A, CHEK2, DICER1, EPCAM, GREM1, HOXB13, MEN1, MLH1, MSH2, MSH3, MSH6, MUTYH, NBN, NF1, NF2, NTHL1, PALB2, PMS2, POLD1, POLE, PTEN, RAD51C, RAD51D, RECQL,  RET, SDHA, SDHAF2, SDHB, SDHC, SDHD, SMAD4, SMARCA4, STK11, TP53, TSC1, TSC2, and VHL.  RNA data is routinely analyzed for use in variant interpretation for all genes.   11/04/2021 Cancer Staging   Staging form: Breast, AJCC 8th Edition - Pathologic: Stage IA (pT1c, pN0, cM0, G2, ER+, PR+, HER2-, Oncotype DX score: 7) - Signed by Sheila Bryant, Sheila K, NP on 11/04/2021 Multigene prognostic tests performed: Oncotype DX Recurrence score range: Less than 11 Histologic grading system: 3 grade system   11/04/2021 Survivorship   SCP delivered by Sheila Burton, NP       REVIEW OF SYSTEMS:   Constitutional: Denies fevers, chills or abnormal weight loss Eyes: Denies blurriness of vision Ears, nose, mouth, throat, and face: Denies mucositis or sore throat Respiratory: Denies cough, dyspnea or wheezes Cardiovascular: Denies palpitation, chest discomfort or lower extremity swelling Gastrointestinal:  Denies nausea, heartburn or change in bowel habits Skin: Denies abnormal skin rashes Lymphatics: Denies new lymphadenopathy or easy bruising Neurological:Denies numbness, tingling or new weaknesses Behavioral/Psych: Mood is stable, no new changes  All other systems were reviewed with the patient and are negative.   VITALS:   Today's Vitals   03/16/24 0913 03/16/24 0918  BP: (!) 137/90   Pulse: (!) 59   Resp: 17   Temp: 97.7 F (36.5 C)   TempSrc: Temporal   SpO2: 100%   Weight: 139 lb 14.4 oz (63.5 kg)   Height: 5' 4 (1.626 m)   PainSc:  0-No pain   Body mass index is 24.01 kg/m.   Wt Readings from Last 3 Encounters:  03/16/24 139 lb 14.4 oz (63.5 kg)  09/14/23 137 lb 11.2 oz (62.5 kg)  02/25/23 136 lb 14.4 oz (62.1 kg)    Body mass index is 24.01 kg/m.  Performance status (ECOG): 0 - Asymptomatic  PHYSICAL EXAM:   GENERAL:alert, no distress and comfortable SKIN: skin color, texture, turgor are normal, no rashes or significant lesions EYES: normal, Conjunctiva are pink and  non-injected, sclera clear OROPHARYNX:no exudate, no erythema and lips, buccal mucosa, and tongue normal  NECK: supple, thyroid normal size, non-tender, without nodularity LYMPH:  no palpable lymphadenopathy in the cervical, axillary or inguinal LUNGS: clear to auscultation and percussion with normal breathing effort HEART: regular rate & rhythm and no murmurs and no lower extremity edema ABDOMEN:abdomen soft, non-tender and normal bowel sounds Musculoskeletal:no cyanosis of digits and no clubbing  NEURO: alert & oriented x 3 with fluent speech, no focal motor/sensory deficits BREAST: The left breast has well-healed lumpectomy scar.  There are no palpable masses or lumps today.  There are expected radiation changes to the skin of the left breast.  There is no nipple inversion or nipple discharge.  There is no axillary lymphadenopathy on the left.  There are no palpable masses or lumps in the right breast.  There is no nipple inversion or nipple discharge.  There is no axillary lymphadenopathy on the right.  LABORATORY DATA:  I  have reviewed the data as listed    Component Value Date/Time   NA 141 03/16/2024 0832   Bryant 4.3 03/16/2024 0832   CL 104 03/16/2024 0832   CO2 34 (H) 03/16/2024 0832   GLUCOSE 85 03/16/2024 0832   BUN 17 03/16/2024 0832   CREATININE 0.84 03/16/2024 0832   CALCIUM 9.4 03/16/2024 0832   PROT 7.3 03/16/2024 0832   ALBUMIN 4.2 03/16/2024 0832   AST 22 03/16/2024 0832   ALT 26 03/16/2024 0832   ALKPHOS 82 03/16/2024 0832   BILITOT 0.6 03/16/2024 0832   GFRNONAA >60 03/16/2024 0832    Lab Results  Component Value Date   WBC 2.8 (L) 03/16/2024   NEUTROABS 1.3 (L) 03/16/2024   HGB 14.1 03/16/2024   HCT 40.4 03/16/2024   MCV 86.0 03/16/2024   PLT 233 03/16/2024

## 2024-03-16 ENCOUNTER — Other Ambulatory Visit: Payer: Self-pay

## 2024-03-16 ENCOUNTER — Inpatient Hospital Stay: Attending: Nurse Practitioner

## 2024-03-16 ENCOUNTER — Inpatient Hospital Stay (HOSPITAL_BASED_OUTPATIENT_CLINIC_OR_DEPARTMENT_OTHER): Admitting: Nurse Practitioner

## 2024-03-16 VITALS — BP 137/90 | HR 59 | Temp 97.7°F | Resp 17 | Ht 64.0 in | Wt 139.9 lb

## 2024-03-16 DIAGNOSIS — Z08 Encounter for follow-up examination after completed treatment for malignant neoplasm: Secondary | ICD-10-CM | POA: Insufficient documentation

## 2024-03-16 DIAGNOSIS — Z923 Personal history of irradiation: Secondary | ICD-10-CM | POA: Insufficient documentation

## 2024-03-16 DIAGNOSIS — C50012 Malignant neoplasm of nipple and areola, left female breast: Secondary | ICD-10-CM

## 2024-03-16 DIAGNOSIS — Z853 Personal history of malignant neoplasm of breast: Secondary | ICD-10-CM | POA: Insufficient documentation

## 2024-03-16 DIAGNOSIS — D72819 Decreased white blood cell count, unspecified: Secondary | ICD-10-CM | POA: Diagnosis not present

## 2024-03-16 DIAGNOSIS — Z170 Estrogen receptor positive status [ER+]: Secondary | ICD-10-CM

## 2024-03-16 DIAGNOSIS — M8580 Other specified disorders of bone density and structure, unspecified site: Secondary | ICD-10-CM | POA: Insufficient documentation

## 2024-03-16 LAB — CBC WITH DIFFERENTIAL (CANCER CENTER ONLY)
Abs Immature Granulocytes: 0 K/uL (ref 0.00–0.07)
Basophils Absolute: 0 K/uL (ref 0.0–0.1)
Basophils Relative: 1 %
Eosinophils Absolute: 0 K/uL (ref 0.0–0.5)
Eosinophils Relative: 1 %
HCT: 40.4 % (ref 36.0–46.0)
Hemoglobin: 14.1 g/dL (ref 12.0–15.0)
Immature Granulocytes: 0 %
Lymphocytes Relative: 40 %
Lymphs Abs: 1.1 K/uL (ref 0.7–4.0)
MCH: 30 pg (ref 26.0–34.0)
MCHC: 34.9 g/dL (ref 30.0–36.0)
MCV: 86 fL (ref 80.0–100.0)
Monocytes Absolute: 0.3 K/uL (ref 0.1–1.0)
Monocytes Relative: 10 %
Neutro Abs: 1.3 K/uL — ABNORMAL LOW (ref 1.7–7.7)
Neutrophils Relative %: 48 %
Platelet Count: 233 K/uL (ref 150–400)
RBC: 4.7 MIL/uL (ref 3.87–5.11)
RDW: 12.4 % (ref 11.5–15.5)
WBC Count: 2.8 K/uL — ABNORMAL LOW (ref 4.0–10.5)
nRBC: 0 % (ref 0.0–0.2)

## 2024-03-16 LAB — CMP (CANCER CENTER ONLY)
ALT: 26 U/L (ref 0–44)
AST: 22 U/L (ref 15–41)
Albumin: 4.2 g/dL (ref 3.5–5.0)
Alkaline Phosphatase: 82 U/L (ref 38–126)
Anion gap: 3 — ABNORMAL LOW (ref 5–15)
BUN: 17 mg/dL (ref 8–23)
CO2: 34 mmol/L — ABNORMAL HIGH (ref 22–32)
Calcium: 9.4 mg/dL (ref 8.9–10.3)
Chloride: 104 mmol/L (ref 98–111)
Creatinine: 0.84 mg/dL (ref 0.44–1.00)
GFR, Estimated: >60 mL/min (ref >60)
Glucose, Bld: 85 mg/dL (ref 70–99)
Potassium: 4.3 mmol/L (ref 3.5–5.1)
Sodium: 141 mmol/L (ref 135–145)
Total Bilirubin: 0.6 mg/dL (ref 0.0–1.2)
Total Protein: 7.3 g/dL (ref 6.5–8.1)

## 2024-03-26 ENCOUNTER — Encounter: Payer: Self-pay | Admitting: Nurse Practitioner

## 2024-03-30 ENCOUNTER — Other Ambulatory Visit: Payer: Self-pay | Admitting: Nurse Practitioner

## 2024-04-27 ENCOUNTER — Other Ambulatory Visit: Payer: Self-pay

## 2024-04-27 ENCOUNTER — Other Ambulatory Visit: Payer: Self-pay | Admitting: Nurse Practitioner

## 2024-05-22 ENCOUNTER — Other Ambulatory Visit

## 2024-05-29 ENCOUNTER — Other Ambulatory Visit: Payer: Self-pay | Admitting: Obstetrics & Gynecology

## 2024-05-29 DIAGNOSIS — Z1231 Encounter for screening mammogram for malignant neoplasm of breast: Secondary | ICD-10-CM

## 2024-06-29 ENCOUNTER — Ambulatory Visit
Admission: RE | Admit: 2024-06-29 | Discharge: 2024-06-29 | Disposition: A | Discharge status: Home / Self Care | Source: Ambulatory Visit | Attending: Obstetrics & Gynecology | Admitting: Obstetrics & Gynecology

## 2024-06-29 DIAGNOSIS — Z1231 Encounter for screening mammogram for malignant neoplasm of breast: Secondary | ICD-10-CM

## 2024-07-13 ENCOUNTER — Other Ambulatory Visit (HOSPITAL_COMMUNITY): Payer: Self-pay

## 2024-09-06 ENCOUNTER — Ambulatory Visit: Admitting: Nurse Practitioner

## 2024-09-06 ENCOUNTER — Other Ambulatory Visit
